# Patient Record
Sex: Female | Born: 1950 | Race: White | Hispanic: No | Smoking: Current every day smoker
Health system: Southern US, Community
[De-identification: ages and names within clinical notes are randomized; demographics above are authoritative.]

## PROBLEM LIST (undated history)

## (undated) DIAGNOSIS — J449 Chronic obstructive pulmonary disease, unspecified: Secondary | ICD-10-CM

## (undated) DIAGNOSIS — F319 Bipolar disorder, unspecified: Secondary | ICD-10-CM

## (undated) DIAGNOSIS — F419 Anxiety disorder, unspecified: Secondary | ICD-10-CM

## (undated) DIAGNOSIS — G8929 Other chronic pain: Secondary | ICD-10-CM

## (undated) DIAGNOSIS — I10 Essential (primary) hypertension: Secondary | ICD-10-CM

## (undated) DIAGNOSIS — I219 Acute myocardial infarction, unspecified: Secondary | ICD-10-CM

## (undated) DIAGNOSIS — R569 Unspecified convulsions: Secondary | ICD-10-CM

## (undated) HISTORY — PX: ROTATOR CUFF REPAIR: SHX139

## (undated) HISTORY — PX: CYSTECTOMY: SUR359

## (undated) HISTORY — PX: BREAST CYST EXCISION: SHX579

## (undated) HISTORY — PX: TUBAL LIGATION: SHX77

## (undated) HISTORY — PX: ABDOMINAL HYSTERECTOMY: SHX81

---

## 2006-06-28 ENCOUNTER — Inpatient Hospital Stay (HOSPITAL_COMMUNITY): Admission: EM | Admit: 2006-06-28 | Discharge: 2006-07-04 | Payer: Self-pay | Admitting: Emergency Medicine

## 2006-06-28 ENCOUNTER — Ambulatory Visit: Payer: Self-pay | Admitting: Cardiovascular Disease

## 2007-05-01 ENCOUNTER — Emergency Department (HOSPITAL_COMMUNITY): Admission: EM | Admit: 2007-05-01 | Discharge: 2007-05-01 | Payer: Self-pay | Admitting: Emergency Medicine

## 2010-05-10 DIAGNOSIS — R0602 Shortness of breath: Secondary | ICD-10-CM

## 2010-05-25 NOTE — H&P (Signed)
Monica Stuart, Monica Stuart                 ACCOUNT NO.:  [id number]   MEDICAL RECORD NO.:  [id number]          PATIENT TYPE:  INP   LOCATION:  A202                          FACILITY:  [facility]   PHYSICIAN:  Marcello Moores, MD   DATE OF BIRTH:  1950-12-20   DATE OF ADMISSION:  06/28/2006  DATE OF DISCHARGE:  LH                              HISTORY & PHYSICAL   PRIMARY CARE PHYSICIAN:  Unassigned.   CHIEF COMPLAINT:  Shortness of breath.   HISTORY OF PRESENT ILLNESS:  Ms. Monica Stuart is 60 year old female patient who  is a chronic smoker with history of COPD, asthma, hypertension,  depression, chronic left shoulder pain, who presented to the emergency  room with shortness of breath.  As per the patient, she had shortness of  breath since yesterday and despite she is taking her inhalers and other  medications properly, her shortness of breath worsened and she decided  to come to the emergency room.  The patient denied any chest pain,  denied fever, but she has mild dry cough.  The patient was given Solu-  Medrol treatment in the emergency room and respiratory treatment as  well, but despite that, she improved very later and admission was  called.  ABG was tried in the emergency room but failed through the  repeated sticks and the patient finally refused to allow further try.  The patient denied any GI or urinary complaint currently.   REVIEW OF SYSTEMS:  A 10-point review of system is noncontributory  except as dictated in the HPI.   ALLERGIES:  NO KNOWN DRUG ALLERGIES.   SOCIAL HISTORY:  [place] and  currently she is not working.  She is on disability.  She is a chronic  smoker but denied alcohol or any illegal drug use.   FAMILY HISTORY:  Noncontributory.   PAST MEDICAL HISTORY:  1. COPD/asthma.  2. Hypertension.  3. Depression.  4. Coronary artery disease, status post cardiac cath, 2000.  5. Chronic left shoulder pain.   HOME MEDICATIONS:  1.  Advil discussed, unspecified dose.  2. Albuterol inhaler p.r.n.  3. Prednisone 10 mg p.o. daily.  4. Ultram 50 mg p.r.n.  5. Diclofenac 100 mg once a day.  6. Seroquel 100 mg p.o. daily.  7. Lortab 5/500 mg four times a day.  8. Alprazolam unspecified dose four times a day.  9. Hydrochlorothiazide unspecified dose p.o. daily.   PHYSICAL EXAMINATION:  GENERAL APPEARANCE:  The patient is in the  emergency room bed and she has mild respiratory distress with  significant wheezes.  VITAL SIGNS:  Blood pressure is 150/74, temperature 97, pulse is 62,  respiratory rate 20 per minute, saturation is 97% on 2 liters.  HEENT:  She has pink conjunctivae.  Nonicteric sclera.  NECK:  Supple.  CHEST:  Positive air entry bilaterally with wheezes and rhonchi  diffusely bilaterally.  CARDIOVASCULAR: S1-S2 regular were heard.  No murmur.  ABDOMEN:  Soft, normoactive bowel sounds, negative tenderness.  EXTREMITIES:  She has no pedal edema.  CNS:  She is alert and well  oriented.   LABORATORY DATA:  White blood cell is 10, hemoglobin is 13, hematocrit  is 38, platelet count 275.  Chemistry shows sodium is 141, potassium is  3.7, chloride is 103, bicarb 29, glucose 144, BUN 7and creatinine 0.8  and calcium is 8.9.   A chest x-ray showed bronchitic changes, otherwise no evidence of  pulmonary mass or nodule.   ASSESSMENT:  1. Chronic obstructive pulmonary disease, asthma exacerbation. will      put her on Solu-Medrol IV and will give her respiratory treatment      every three to four hours and will continue her home medications      and will try to get ABG when the patient allows to do so.  2. Hypertension.  Fairly controlled and will continue with home      medications.  3. Coronary artery disease.  The patient has no chest pain, but will      do tomorrow echo and will send cardiac enzymes and EKG to make sure      that there is no event of ischemia.  4. Other medical problems are stable and will  continue with her home      medications and will put her on gastrointestinal as well as deep      venous thrombosis prophylaxis.      Marcello Moores, MD  Electronically Signed     MT/MEDQ  D:  06/28/2006  T:  06/29/2006  Job:  [id number]

## 2010-05-25 NOTE — Group Therapy Note (Signed)
NAME:  Monica Stuart, Monica Stuart                 ACCOUNT NO.:  [id number]   MEDICAL RECORD NO.:  [id number]          PATIENT TYPE:  INP   LOCATION:  A202                          FACILITY:  [facility]   PHYSICIAN:  Edward L. Juanetta Gosling, M.D.DATE OF BIRTH:  05/02/1950   DATE OF PROCEDURE:  DATE OF DISCHARGE:                                 PROGRESS NOTE   Patient of the hospitalist.  Monica Stuart seems to be doing pretty well.  She is having no new complaints.   PHYSICAL EXAMINATION:  Temperature is 97.9, pulse 75, respirations 20,  blood pressure 159/87, blood sugar 115.  Had ordered O2 sat on room air.   Her white count today 16,300, hemoglobin is 13.9, platelets 298,  electrolytes, CO2 is 34 be glucose 117, otherwise normal.   ASSESSMENT:  She is got severe chronic obstructive pulmonary disease.   PLAN:  Try to see if we can get her oxygenated.  She may need O2 at  home.  She clearly needs to stop smoking and she is hoping to be able to  do that.      Edward L. Juanetta Gosling, M.D.  Electronically Signed     ELH/MEDQ  D:  07/04/2006  T:  07/04/2006  Job:  [id number]

## 2010-05-25 NOTE — Consult Note (Signed)
NAME:  Monica Stuart, Monica Stuart                 ACCOUNT NO.:  [id number]   MEDICAL RECORD NO.:  [id number]          PATIENT TYPE:  INP   LOCATION:  A202                          FACILITY:  [facility]   PHYSICIAN:  Edward L. Juanetta Gosling, M.D.DATE OF BIRTH:  [birth year]   DATE OF CONSULTATION:  DATE OF DISCHARGE:                                 CONSULTATION   Monica Stuart is a 60 year old who has a long known history of multiple  medical problems including COPD with an asthmatic component,  hypertension, depression, chronic left shoulder pain, coronary artery  occlusive disease.  She had been hospitalized since the 18th, with an  acute exacerbation of COPD.  She says she feels like she is about back  to baseline now.  She is not having any chest pain.  She does have some  cough still.  She is bringing up some sputum.  She has not had any  fever.   PAST MEDICAL HISTORY:  Positive for the COPD/asthma, hypertension,  depression, coronary disease, status post cardiac catheterization and  left shoulder pain.   MEDICATIONS:  She is on:  1. Advair, I believe the 250/50 size at home.  2. Albuterol inhaler on a p.r.n. basis.  3. A nebulizer at home on a p.r.n. basis.  4. Prednisone 10 mg daily.  5. Ultram 50 mg q.4 h p.r.n. pain.  6. Diclofenac 100 mg daily.  7. Seroquel 100 mg daily.  8. Lortab 5/500 four times a day as needed.  9. Xanax I am not sure that dose.  10.HCTZ.   FAMILY HISTORY:  She has a brother who recently died of lung cancer, so  she is very interested in trying to stop smoking.   SOCIAL HISTORY:  She has about a 50 pack-year smoking history.  She does  not use any alcohol or use any illicit drugs.   REVIEW OF SYSTEMS:  Other than as mentioned is negative.   PHYSICAL EXAMINATION:  GENERAL:  Shows a well-developed, well-nourished  female who does not appear to be in any acute distress now.  VITAL SIGNS:  Her temperature is 98.3, pulse 70, respirations 18, blood  pressure 162/101, O2 sats 98%  on 2 liters, blood sugar 156.  She also  has a history of diabetes, and she says she has been started on Crestor,  but she says that is for her blood sugar and of course it is for  cholesterol.  I am not sure if she has got the medication confused for  what.  HEENT:  Her pupils are reactive, and nose and throat are clear.  NECK:  Supple without masses.  CHEST:  With rhonchi bilaterally.  HEART:  Regular without murmur, gallop or rub.  ABDOMEN:  Soft.  EXTREMITIES:  Showed no edema.  CENTRAL NERVOUS SYSTEM:  Examination shows that she appears to be  anxious but otherwise intact.   LABORATORY WORK:  Blood gas shows a pH 7.46, pCO2 of 36, pO2 of 61, this  on room air.  CBC:  White count 13,800, hemoglobin 13.9, platelets 306.  BMET shows CO2 is 33, glucose 163,  BUN 15, creatinine 0.8.  CHEST X-RAY:  Bronchitis.   ASSESSMENT:  I think she does have severe chronic obstructive pulmonary  disease, and at this point I agree with treatment plan.  I would like to  check an O2 sat in the morning, off of oxygen.  She says she is getting  close to her baseline, and I expect that she probably is.  She clearly  needs to stop smoking if she can, and as best I can tell she has not had  a pulmonary function test done recently, but that could be done as an  outpatient, when she is doing a bit better.  Thanks for allowing me to  see her with you.      Edward L. Juanetta Gosling, M.D.  Electronically Signed     ELH/MEDQ  D:  07/03/2006  T:  07/04/2006  Job:  [id number]   cc:   Samuel Jester  Fax: [phone]

## 2010-05-25 NOTE — Procedures (Signed)
NAME:  Monica Stuart, Monica Stuart                 ACCOUNT NO.:  [id number]   MEDICAL RECORD NO.:  [id number]          PATIENT TYPE:  INP   LOCATION:  A202                          FACILITY:  [facility]   PHYSICIAN:  Noralyn Pick. Eden Emms, MD, FACCDATE OF BIRTH:  1950-08-03   DATE OF PROCEDURE:  06/30/2006  DATE OF DISCHARGE:                                ECHOCARDIOGRAM   PROCEDURE:  2 D echocardiogram.   INDICATION:  Chronic obstructive pulmonary disease, asthma, check left  ventricular function.  Left ventricular cavity size is normal.  There is  mild LVH, EF of 60%.  There are no regional wall motion abnormalities.  There is mild left atrial enlargement.  Right side of cardiac chambers  were normal.  There was no evidence of pulmonary hypertension.  There  was mild TR.  Aortic valve was trileaflet and sclerotic.  There was no  stenosis.  Aortic root was normal.  Mitral valve was mildly thickened  with trivial mitral regurgitation.  Subcostal imaging revealed no  atrioseptal defect, no source of embolus, no effusion.  M-mode  measurement showed an aortic dimension of 27 mm.  Left atrial dimension  was 41 mm.  Septal thickness was 14 mm.  LV diastolic dimension was 41  mm.  LV systolic dimension was 41 mm.  LV systolic dimension was 29 mm.   FINAL IMPRESSION:  1. Mild left ventricular hypertrophy, ejection fraction 60%.  2. Mild left atrial enlargement.  3. Aortic valve sclerosis.  4. Trivial mitral regurgitation.  5. Normal right ventricle.  6. No pericardial effusion.      Noralyn Pick. Eden Emms, MD, [facility]  Electronically Signed     PCN/MEDQ  D:  06/30/2006  T:  06/30/2006  Job:  [id number]

## 2010-05-25 NOTE — Discharge Summary (Signed)
NAMEKHALEA, VENTURA                 ACCOUNT NO.:  [id number]   MEDICAL RECORD NO.:  [id number]          PATIENT TYPE:  INP   LOCATION:  A202                          FACILITY:  [facility]   PHYSICIAN:  Osvaldo Shipper, MD     DATE OF BIRTH:  12/25/1950   DATE OF ADMISSION:  06/28/2006  DATE OF DISCHARGE:  06/24/2008LH                               DISCHARGE SUMMARY   PRIMARY CARE PHYSICIAN:  Samuel Jester, M.D.   DISCHARGE DIAGNOSES:  1. Acute chronic obstructive pulmonary disease exacerbation improved.  2. History of hypertension requiring additional medications.  3. History of depression.  4. History of coronary artery disease, stable.  5. History of chronic left shoulder pain with recent surgery.   Please see full H&P dictated by Marcello Moores, M.D. for details  regarding the patient's presenting illness.   HOSPITAL COURSE:  Briefly, this is a 60 year old Caucasian female who  presented to the hospital with shortness of breath.  She is a smoker  with a history of COPD and was diagnosed with COPD exacerbation.  The  patient was put on steroids on nebulizer treatment with antibiotics.  She was slow to improve.  She was also seen by pulmonologist, Dr.  Juanetta Gosling.  She had ABG on presentation which showed a pH 7.41, pCO2 42.  Her bicarb was 29 on the peripheral blood.  The patient was essentially  managed for the above mentioned treatment.  Over the past two days she  has been feeling much better.  Today she is able to ambulate with no  difficulty.  She subjectively feels much better as well.  She is very  keen on going home.  She is on a nicotine patch and requesting a  prescription for Chantix.  I have told her that she needs to talk to her  PMD about getting the same.  I will write out a prescription for  nicotine patch, however.   She was also concerned about the diagnosis of diabetes.  Her blood  sugars were a little bit high, the highest was 173, probably because the  patient  was on steroids.  The patient is on a chronic dose of 10 mg of  prednisone on a daily basis.  Unfortunately her HB1C was not checked  during this admission.  I have told her that she needed to talk to her  physician about pursuing evaluation and diagnosis of diabetes.  At this  point, her blood sugars have improved to 115, 170, 123.  Hence I do not  see the need to really start her on immediate medication.  I have  encouraged her to talk to her PMD quickly as soon as possible.   Her blood pressures have been running a little bit high, not well  controlled with hydrochlorothiazide, hence, I am going to add a dose of  Norvasc to her regimen.   Her room air O2 was 93 today.  She has been ambulating.  I think she is  ready to go home.  Her last PFT was more than 5 years ago, hence, I  think we will  set up another PFT with [facility] of her as an outpatient in a  couple of weeks.  I will also set up an appointment with Dr. Juanetta Gosling in  a couple of weeks.  She did have an x-ray done on admission of the chest  that did not show any evidence for a mass or a nodule.   The patient has been strongly counseled about smoking cessation and she  seems to understands and seems motivated.   DISCHARGE MEDICATIONS:  1. Norvasc 5 mg p.o. daily.  2. Albuterol nebulizer 2.5 mg every 6 hours.  3. Atrovent nebulizer 0.5 mg every 6 hours.  4. Nicotine patch 21 mg daily.  5. Levaquin 750 mg daily for two more days.  6. Prednisone taper 60 mg for three days, 40 mg for five days, 20 mg      for five days, and then 10 mg daily as before.  7. Crestor unknown dose daily.  8. Hydrochlorothiazide 25 mg daily.  9. Xanax 0.25 mg q.i.d.  10.Lortab 5/500 mg as needed.  11.Seroquel 100 mg q.h.s.  12.Diclofenac as needed.  13.Ultram as needed.  14.Cymbalta 60 mg daily.  15.Advair Diskus twice daily.   FOLLOWUP:  1. With her PMD in 1 weeks' time.  2. With Dr. Juanetta Gosling in 3 weeks.  3. PFT with [facility] in 2 weeks.   DIET:   She can have a heart healthy diet.   ACTIVITY:  Increase activity slowly.   The patient does not qualify for home O2 at this time as her saturations  are 93% on room air.   CONSULTATIONS:  Dr. Juanetta Gosling during this admission.   Total time of discharge was about 35 minutes.      Osvaldo Shipper, MD  Electronically Signed     GK/MEDQ  D:  07/04/2006  T:  07/04/2006  Job:  [id number]   cc:   Ramon Dredge L. Juanetta Gosling, M.D.  Fax: [phone]   Samuel Jester  Fax: [phone]

## 2010-10-27 LAB — BASIC METABOLIC PANEL
BUN: 16
BUN: 7
CO2: 27
CO2: 28
CO2: 33 — ABNORMAL HIGH
CO2: 34 — ABNORMAL HIGH
Calcium: 8.6
Calcium: 8.7
Chloride: 102
Chloride: 103
Chloride: 104
Chloride: 105
Chloride: 106
Chloride: 109
Creatinine, Ser: 0.77
GFR calc Af Amer: >60
GFR calc Af Amer: >60
GFR calc Af Amer: >60
GFR calc Af Amer: >60
GFR calc non Af Amer: >60
Glucose, Bld: 144 — ABNORMAL HIGH
Glucose, Bld: 150 — ABNORMAL HIGH
Glucose, Bld: 172 — ABNORMAL HIGH
Glucose, Bld: 173 — ABNORMAL HIGH
Potassium: 3.7
Potassium: 3.7
Potassium: 4.1
Potassium: 4.5
Potassium: 4.5
Potassium: 4.7
Sodium: 139
Sodium: 141
Sodium: 141
Sodium: 143
Sodium: 144

## 2010-10-27 LAB — DIFFERENTIAL
Band Neutrophils: 0
Basophils Absolute: 0
Basophils Relative: 0
Basophils Relative: 0
Basophils Relative: 0
Blasts: 0
Eosinophils Absolute: 0.1
Eosinophils Absolute: 0.1
Eosinophils Relative: 0
Eosinophils Relative: 1
Lymphocytes Relative: 12
Lymphocytes Relative: 16
Lymphocytes Relative: 17
Lymphs Abs: 1.7
Metamyelocytes Relative: 0
Monocytes Absolute: 0.4
Monocytes Absolute: 0.6
Monocytes Relative: 4
Myelocytes: 0
Neutro Abs: 12 — ABNORMAL HIGH
Neutro Abs: 9.1 — ABNORMAL HIGH
Neutrophils Relative %: 56
Promyelocytes Absolute: 0
Promyelocytes Absolute: 0
nRBC: 1 — ABNORMAL HIGH

## 2010-10-27 LAB — BLOOD GAS, ARTERIAL
Acid-Base Excess: 3 — ABNORMAL HIGH
Bicarbonate: 25.6 — ABNORMAL HIGH
O2 Content: 2.5
O2 Saturation: 96.8
TCO2: 22.1
TCO2: 24
pCO2 arterial: 42.9
pH, Arterial: 7.461 — ABNORMAL HIGH
pO2, Arterial: 85.2

## 2010-10-27 LAB — CARDIAC PANEL(CRET KIN+CKTOT+MB+TROPI)
CK, MB: 1.1
Relative Index: INVALID
Relative Index: INVALID
Relative Index: INVALID
Total CK: 28
Total CK: 36
Troponin I: 0.03

## 2010-10-27 LAB — CBC
HCT: 36.7
HCT: 38.3
HCT: 38.7
Hemoglobin: 12.7
Hemoglobin: 13.5
Hemoglobin: 13.9
MCHC: 34
MCHC: 34.5
MCHC: 34.5
MCV: 84.7
MCV: 85.9
MCV: 86.6
MCV: 86.9
Platelets: 275
Platelets: 294
RBC: 4.28
RBC: 4.64
RDW: 12.9
RDW: 13.3
RDW: 13.4
WBC: 10.1

## 2011-01-23 ENCOUNTER — Emergency Department (HOSPITAL_COMMUNITY)
Admission: EM | Admit: 2011-01-23 | Discharge: 2011-01-23 | Disposition: A | Discharge status: Home / Self Care | Payer: Medicare Other | Attending: Emergency Medicine | Admitting: Emergency Medicine

## 2011-01-23 ENCOUNTER — Emergency Department (HOSPITAL_COMMUNITY): Payer: Medicare Other

## 2011-01-23 ENCOUNTER — Other Ambulatory Visit: Payer: Self-pay

## 2011-01-23 ENCOUNTER — Encounter (HOSPITAL_COMMUNITY): Payer: Self-pay | Admitting: Emergency Medicine

## 2011-01-23 DIAGNOSIS — I10 Essential (primary) hypertension: Secondary | ICD-10-CM | POA: Insufficient documentation

## 2011-01-23 DIAGNOSIS — Z9079 Acquired absence of other genital organ(s): Secondary | ICD-10-CM | POA: Insufficient documentation

## 2011-01-23 DIAGNOSIS — I252 Old myocardial infarction: Secondary | ICD-10-CM | POA: Insufficient documentation

## 2011-01-23 DIAGNOSIS — Z7982 Long term (current) use of aspirin: Secondary | ICD-10-CM | POA: Insufficient documentation

## 2011-01-23 DIAGNOSIS — F319 Bipolar disorder, unspecified: Secondary | ICD-10-CM | POA: Insufficient documentation

## 2011-01-23 DIAGNOSIS — Z9851 Tubal ligation status: Secondary | ICD-10-CM | POA: Insufficient documentation

## 2011-01-23 DIAGNOSIS — J441 Chronic obstructive pulmonary disease with (acute) exacerbation: Secondary | ICD-10-CM | POA: Insufficient documentation

## 2011-01-23 DIAGNOSIS — G8929 Other chronic pain: Secondary | ICD-10-CM | POA: Insufficient documentation

## 2011-01-23 DIAGNOSIS — F17200 Nicotine dependence, unspecified, uncomplicated: Secondary | ICD-10-CM | POA: Insufficient documentation

## 2011-01-23 DIAGNOSIS — E119 Type 2 diabetes mellitus without complications: Secondary | ICD-10-CM | POA: Insufficient documentation

## 2011-01-23 DIAGNOSIS — R569 Unspecified convulsions: Secondary | ICD-10-CM | POA: Insufficient documentation

## 2011-01-23 HISTORY — DX: Other chronic pain: G89.29

## 2011-01-23 HISTORY — DX: Essential (primary) hypertension: I10

## 2011-01-23 HISTORY — DX: Unspecified convulsions: R56.9

## 2011-01-23 HISTORY — DX: Acute myocardial infarction, unspecified: I21.9

## 2011-01-23 HISTORY — DX: Chronic obstructive pulmonary disease, unspecified: J44.9

## 2011-01-23 HISTORY — DX: Bipolar disorder, unspecified: F31.9

## 2011-01-23 LAB — POCT I-STAT TROPONIN I: Troponin i, poc: 0 ng/mL (ref 0.00–0.08)

## 2011-01-23 MED ORDER — ALBUTEROL SULFATE HFA 108 (90 BASE) MCG/ACT IN AERS: 1.0000 | INHALATION_SPRAY | Freq: Four times a day (QID) | RESPIRATORY_TRACT | Status: DC | PRN

## 2011-01-23 MED ORDER — IPRATROPIUM BROMIDE 0.02 % IN SOLN
0.5000 mg | Freq: Once | RESPIRATORY_TRACT | Status: AC
  Administered 2011-01-23: 0.5 mg via RESPIRATORY_TRACT
  Filled 2011-01-23: qty 2.5

## 2011-01-23 MED ORDER — PREDNISONE 20 MG PO TABS
40.0000 mg | ORAL_TABLET | Freq: Once | ORAL | Status: AC
  Administered 2011-01-23: 40 mg via ORAL
  Filled 2011-01-23: qty 2

## 2011-01-23 MED ORDER — PREDNISONE 20 MG PO TABS
ORAL_TABLET | ORAL | Status: AC
  Filled 2011-01-23: qty 1

## 2011-01-23 MED ORDER — PREDNISONE 20 MG PO TABS: 40.0000 mg | ORAL_TABLET | Freq: Every day | ORAL | Status: AC

## 2011-01-23 MED ORDER — ALBUTEROL SULFATE (5 MG/ML) 0.5% IN NEBU
5.0000 mg | INHALATION_SOLUTION | Freq: Once | RESPIRATORY_TRACT | Status: AC
  Administered 2011-01-23: 5 mg via RESPIRATORY_TRACT
  Filled 2011-01-23: qty 1

## 2011-01-23 NOTE — ED Provider Notes (Signed)
History    60yf with cough and sob. Symptom onset about a week ago. Relatively constant. Denies chest pain. No fever or chills. Has a history of COPD. She continues to smoke. No unusual lower extremity edema or leg pain. Denies history of blood clot. No Sick contacts.  CSN: [id number]  Arrival date & time 01/23/11  1731   First MD Initiated Contact with Patient 01/23/11 1752      Chief Complaint  Patient presents with  . Shortness of Breath  . Cough    (Consider location/radiation/quality/duration/timing/severity/associated sxs/prior treatment) HPI  Past Medical History  Diagnosis Date  . COPD (chronic obstructive pulmonary disease)   . Asthma   . Diabetes mellitus   . MI (myocardial infarction)   . Hypertension   . Seizures   . Manic depressive disorder   . Chronic pain     Past Surgical History  Procedure Date  . Rotator cuff repair   . Breast cyst excision   . Cystectomy     left leg  . Tubal ligation   . Abdominal hysterectomy     Family History  Problem Relation Age of Onset  . Cancer Mother   . Stroke Father     History  Substance Use Topics  . Smoking status: Current Everyday Smoker -- 0.5 packs/day for 40 years    Types: Cigarettes  . Smokeless tobacco: Never Used  . Alcohol Use: No    OB History    Grav Para Term Preterm Abortions TAB SAB Ect Mult Living   4 4 4       4       Review of Systems   Review of symptoms negative unless otherwise noted in HPI.   Allergies  Mucinex  Home Medications   Current Outpatient Rx  Name Route Sig Dispense Refill  . ALPRAZOLAM 1 MG PO TABS Oral Take 1 mg by mouth 4 (four) times daily.    Marland Kitchen AMLODIPINE BESYLATE 5 MG PO TABS Oral Take 5 mg by mouth daily.    . ASPIRIN EC 81 MG PO TBEC Oral Take 81 mg by mouth daily.    Marland Kitchen DIAZEPAM 10 MG PO TABS Oral Take 10 mg by mouth at bedtime.    . FUROSEMIDE 40 MG PO TABS Oral Take 40 mg by mouth daily. Patient takes 1 tablet in the morning and 1/2 tablet every  afternoon    . LOSARTAN POTASSIUM 100 MG PO TABS Oral Take 100 mg by mouth daily.    Marland Kitchen METFORMIN HCL 500 MG PO TABS Oral Take 500 mg by mouth daily.    Marland Kitchen MONTELUKAST SODIUM 10 MG PO TABS Oral Take 10 mg by mouth at bedtime.    . OMEPRAZOLE 20 MG PO CPDR Oral Take 20 mg by mouth 2 (two) times daily.    . OXYCODONE HCL 15 MG PO TABS Oral Take 15 mg by mouth every 6 (six) hours as needed. Pain    . ROFLUMILAST 500 MCG PO TABS Oral Take 500 mcg by mouth daily.    Marland Kitchen TIOTROPIUM BROMIDE MONOHYDRATE 18 MCG IN CAPS Inhalation Place 18 mcg into inhaler and inhale daily.      BP 117/43  Pulse 83  Temp(Src) 97.7 F (36.5 C) (Oral)  Resp 21  Ht 5\' 5"  (1.651 m)  Wt 140 lb (63.504 kg)  BMI 23.30 kg/m2  SpO2 100%  Physical Exam  Nursing note and vitals reviewed. Constitutional: She appears well-developed and well-nourished. No distress.  HENT:  Head: Normocephalic  and atraumatic.  Right Ear: External ear normal.  Left Ear: External ear normal.  Mouth/Throat: Oropharynx is clear and moist.  Eyes: Conjunctivae are normal. Right eye exhibits no discharge. Left eye exhibits no discharge.  Neck: Normal range of motion. Neck supple.  Cardiovascular: Normal rate, regular rhythm and normal heart sounds.  Exam reveals no gallop and no friction rub.   No murmur heard. Pulmonary/Chest: Effort normal. No respiratory distress. She has wheezes. She exhibits no tenderness.       Faint expiratory wheezing throughout.  Abdominal: Soft. She exhibits no distension. There is no tenderness.  Musculoskeletal: She exhibits no edema and no tenderness.  Lymphadenopathy:    She has no cervical adenopathy.  Neurological: She is alert.  Skin: Skin is warm and dry. She is not diaphoretic.  Psychiatric: She has a normal mood and affect. Her behavior is normal. Thought content normal.    ED Course  Procedures (including critical care time)   Labs Reviewed  POCT I-STAT TROPONIN I  LAB REPORT - SCANNED  I-STAT  TROPONIN I   Dg Chest 2 View  01/23/2011  *RADIOLOGY REPORT*  Clinical Data: Shortness of breath.  CHEST - 2 VIEW  Comparison: Chest 06/28/2006.  Findings: The chest is hyperexpanded but the lungs are clear.  No pneumothorax or effusion.  Heart size normal.  IMPRESSION: Pulmonary hyperexpansion compatible with emphysema.  No acute disease.  Original Report Authenticated By: Bernadene Bell. D'ALESSIO, M.D.    EKG:  Rhythm: Normal sinus Rate: 75 Axis: Normal Intervals: Normal ST segments: Nonspecific ST changes. T wave flattening in V2.  1. COPD exacerbation       MDM  61 year old female with cough and congestion. Clinically suspect COPD exacerbation. Chest x-ray is consistent with chronic changes likely from COPD. No evidence of acute focal infiltrate. Patient is afebrile. No respiratory distress on exam. EKG does not have any ischemic changes. Troponin is within normal limits. Low clinical suspicion for ACS. Plan course of steroids and symptomatic treatment. Return precautions discussed. Outpatient followup.        Raeford Razor, MD 02/01/11 1325

## 2011-01-23 NOTE — ED Notes (Signed)
Patient brought in via EMS from home. Alert and oriented. Airway patent. Patient c/o cough, congestion, and shortness of breath x1 week that has progressively gotten worse. Denies any fevers. Hx of asthma/COPD. Per EMS 1 duoneb given. O2 sat 98% on room air per EMS.

## 2011-08-04 ENCOUNTER — Emergency Department (HOSPITAL_COMMUNITY): Payer: Medicare Other

## 2011-08-04 ENCOUNTER — Emergency Department (HOSPITAL_COMMUNITY)
Admission: EM | Admit: 2011-08-04 | Discharge: 2011-08-04 | Disposition: A | Discharge status: Home / Self Care | Payer: Medicare Other | Attending: Emergency Medicine | Admitting: Emergency Medicine

## 2011-08-04 ENCOUNTER — Encounter (HOSPITAL_COMMUNITY): Payer: Self-pay | Admitting: Emergency Medicine

## 2011-08-04 DIAGNOSIS — Y9301 Activity, walking, marching and hiking: Secondary | ICD-10-CM | POA: Insufficient documentation

## 2011-08-04 DIAGNOSIS — E119 Type 2 diabetes mellitus without complications: Secondary | ICD-10-CM | POA: Insufficient documentation

## 2011-08-04 DIAGNOSIS — J449 Chronic obstructive pulmonary disease, unspecified: Secondary | ICD-10-CM | POA: Insufficient documentation

## 2011-08-04 DIAGNOSIS — IMO0002 Reserved for concepts with insufficient information to code with codable children: Secondary | ICD-10-CM | POA: Insufficient documentation

## 2011-08-04 DIAGNOSIS — G8929 Other chronic pain: Secondary | ICD-10-CM | POA: Insufficient documentation

## 2011-08-04 DIAGNOSIS — W19XXXA Unspecified fall, initial encounter: Secondary | ICD-10-CM | POA: Insufficient documentation

## 2011-08-04 DIAGNOSIS — F17200 Nicotine dependence, unspecified, uncomplicated: Secondary | ICD-10-CM | POA: Insufficient documentation

## 2011-08-04 DIAGNOSIS — Z79899 Other long term (current) drug therapy: Secondary | ICD-10-CM | POA: Insufficient documentation

## 2011-08-04 DIAGNOSIS — I252 Old myocardial infarction: Secondary | ICD-10-CM | POA: Insufficient documentation

## 2011-08-04 DIAGNOSIS — S73102A Unspecified sprain of left hip, initial encounter: Secondary | ICD-10-CM

## 2011-08-04 DIAGNOSIS — Z7982 Long term (current) use of aspirin: Secondary | ICD-10-CM | POA: Insufficient documentation

## 2011-08-04 DIAGNOSIS — J4489 Other specified chronic obstructive pulmonary disease: Secondary | ICD-10-CM | POA: Insufficient documentation

## 2011-08-04 MED ORDER — ONDANSETRON HCL 4 MG/2ML IJ SOLN
4.0000 mg | Freq: Four times a day (QID) | INTRAMUSCULAR | Status: DC | PRN
  Administered 2011-08-04: 4 mg via INTRAMUSCULAR
  Filled 2011-08-04: qty 2

## 2011-08-04 MED ORDER — METHOCARBAMOL 500 MG PO TABS: ORAL_TABLET | ORAL | Status: DC

## 2011-08-04 MED ORDER — MORPHINE SULFATE 4 MG/ML IJ SOLN
4.0000 mg | Freq: Once | INTRAMUSCULAR | Status: AC
  Administered 2011-08-04: 4 mg via INTRAMUSCULAR
  Filled 2011-08-04: qty 1

## 2011-08-04 MED ORDER — OXYCODONE-ACETAMINOPHEN 5-325 MG PO TABS
1.0000 | ORAL_TABLET | Freq: Once | ORAL | Status: AC
  Administered 2011-08-04: 1 via ORAL
  Filled 2011-08-04: qty 1

## 2011-08-04 MED ORDER — PERCOCET 5-325 MG PO TABS: 1.0000 | ORAL_TABLET | Freq: Four times a day (QID) | ORAL | Status: AC | PRN

## 2011-08-04 NOTE — ED Provider Notes (Cosign Needed)
History  This chart was scribed for Ward Givens, MD by Erskine Emery. This patient was seen in room APFT20/APFT20 and the patient's care was started at 13:34.   CSN: [id number]  Arrival date & time 08/04/11  1325   First MD Initiated Contact with Patient 08/04/11 1334      Chief Complaint  Patient presents with  . Fall    (Consider location/radiation/quality/duration/timing/severity/associated sxs/prior treatment) HPI  YESLY GERETY is a 61 y.o. female who presents to the Emergency Department complaining of a throbbing, shooting left hip pain. Pt reports she fell yesterday when he flip flop broke and fell  on to her left side and arm. Pt was not able to get up after the fall but has been ambulatory with a walker since. Pt typically walks independently. Pt also has some erythema on the left wrist from a burn. She denies hitting her head or LOC.  Pt has a h/o heart attack and stent placement (2) in 2000. Pt also has a h/o DM and typically has fluctuating blood-glucose levels so she ate a danish just PTA.  Pt's PCP is Sherlean Foot.  Pt had a shoulder surgery in [place] but cannot recall who her orthopedist was.  Past Medical History  Diagnosis Date  . COPD (chronic obstructive pulmonary disease)   . Asthma   . Diabetes mellitus   . MI (myocardial infarction)   . Hypertension   . Seizures   . Manic depressive disorder   . Chronic pain     Past Surgical History  Procedure Date  . Rotator cuff repair   . Breast cyst excision   . Cystectomy     left leg  . Tubal ligation   . Abdominal hysterectomy     Family History  Problem Relation Age of Onset  . Cancer Mother   . Stroke Father     History  Substance Use Topics  . Smoking status: Current Everyday Smoker -- 0.5 packs/day for 40 years    Types: Cigarettes  . Smokeless tobacco: Never Used  . Alcohol Use: No  lives alone  OB History    Grav Para Term Preterm Abortions TAB SAB Ect Mult Living   4 4 4       4      Pt  lives alone and still smokes sometimes.   Review of Systems  Constitutional: Negative for fever and chills.  HENT: Negative for neck pain.   Respiratory: Negative for shortness of breath.   Cardiovascular: Negative for chest pain.  Gastrointestinal: Negative for nausea, vomiting and abdominal pain.  Musculoskeletal: Positive for arthralgias (left hip and shoulder pain).  Neurological: Negative for weakness.    Allergies  Amitriptyline and Guaifenesin er  Home Medications   Current Outpatient Rx  Name Route Sig Dispense Refill  . ALBUTEROL SULFATE HFA 108 (90 BASE) MCG/ACT IN AERS Inhalation Inhale 1-2 puffs into the lungs every 6 (six) hours as needed for wheezing. 1 Inhaler 2  . ALPRAZOLAM 2 MG PO TABS Oral Take 2 mg by mouth 3 (three) times daily.    Marland Kitchen AMLODIPINE BESYLATE 5 MG PO TABS Oral Take 5 mg by mouth daily.    . ASPIRIN EC 81 MG PO TBEC Oral Take 81 mg by mouth daily.    Marland Kitchen FLUTICASONE PROPIONATE  HFA 220 MCG/ACT IN AERO Inhalation Inhale 1 puff into the lungs daily as needed.    . FUROSEMIDE 40 MG PO TABS Oral Take 20-40 mg by mouth daily. Patient takes  1 tablet in the morning and 1/2 tablet every afternoon    . IBUPROFEN-DIPHENHYDRAMINE CIT 200-38 MG PO TABS Oral Take 1 tablet by mouth at bedtime as needed. Sleep    . LOSARTAN POTASSIUM 100 MG PO TABS Oral Take 100 mg by mouth daily.    Marland Kitchen METFORMIN HCL 500 MG PO TABS Oral Take 500 mg by mouth daily.    Marland Kitchen MONTELUKAST SODIUM 10 MG PO TABS Oral Take 10 mg by mouth at bedtime.    . CENTRUM PO Oral Take 1 tablet by mouth daily.    . OMEGA-3-ACID ETHYL ESTERS 1 G PO CAPS Oral Take 1 g by mouth daily.    Marland Kitchen OMEPRAZOLE 20 MG PO CPDR Oral Take 20 mg by mouth 2 (two) times daily.    . OXYCODONE HCL 15 MG PO TABS Oral Take 15 mg by mouth every 6 (six) hours as needed. Pain    . ROFLUMILAST 500 MCG PO TABS Oral Take 500 mcg by mouth daily.    Marland Kitchen TIOTROPIUM BROMIDE MONOHYDRATE 18 MCG IN CAPS Inhalation Place 18 mcg into inhaler and  inhale daily.    Marland Kitchen VITAMIN E 400 UNITS PO CAPS Oral Take 400 Units by mouth daily.      Dispense as written.    BP 154/71  Pulse 100  Temp 98.5 F (36.9 C) (Oral)  Resp 23  Ht 5\' 6"  (1.676 m)  Wt 149 lb (67.586 kg)  BMI 24.05 kg/m2  SpO2 98%  Vital signs normal    Physical Exam  Nursing note and vitals reviewed. Constitutional: She is oriented to person, place, and time. She appears well-developed and well-nourished. No distress.  HENT:  Head: Normocephalic and atraumatic.  Right Ear: External ear normal.  Left Ear: External ear normal.  Nose: Nose normal.  Mouth/Throat: Oropharynx is clear and moist.  Eyes: Conjunctivae and EOM are normal. Pupils are equal, round, and reactive to light.  Neck: Normal range of motion. Neck supple. No tracheal deviation present.  Cardiovascular: Normal rate, regular rhythm and normal heart sounds.   Pulmonary/Chest: Effort normal and breath sounds normal. No respiratory distress.  Abdominal: Soft. Bowel sounds are normal.  Musculoskeletal: Normal range of motion. She exhibits tenderness.       Tender over the true left hip joint. Clavicle was nontender, good ROM. Pt has a pilllow under her left knee to keep her hip in a comfrotable position. Unable to assess for limb shortening or rotation.    Neurological: She is alert and oriented to person, place, and time.  Skin: Skin is warm and dry. There is erythema.       Burn on left wrist  Psychiatric: She has a normal mood and affect. Her behavior is normal.    ED Course  Procedures (including critical care time)  Medications  ondansetron (ZOFRAN) injection 4 mg (4 mg Intramuscular Given 08/04/11 1402)  morphine 4 MG/ML injection 4 mg (4 mg Intramuscular Given 08/04/11 1404)  oxyCODONE-acetaminophen (PERCOCET/ROXICET) 5-325 MG per tablet 1 tablet (1 tablet Oral Given 08/04/11 1738)      DIAGNOSTIC STUDIES: Oxygen Saturation is 98% on room air, normal by my interpretation.    COORDINATION  OF CARE: 13:43--I evaluated the patient and we discussed a treatment plan including x-rays to which the pt agreed.   13:51--Medication order: Ondansetron (Zofran) injection 4 mg every 6 hours PRN  14:00--Medication order: morphine 4 mg/mL injection 4 mg--once  14:55--I rechecked the pt, her pain has decreased from the medication. Pt  is agreeable to trying an MRI for further analysis.   Review of [place] controlled site shows patient gets 120 oxycodone 15 mg tablets, the last was July 1. Patient states she thinks her grandchildren still her pain medicine 2 weeks ago and she's been without her pain medicine. She was advised she will need followup with Dr. Prudy Feeler to get back on her regularly scheduled pain medication.   Dg Chest 1 View  08/04/2011  *RADIOLOGY REPORT*  Clinical Data:  Fall, smoker  CHEST - 1 VIEW  Comparison: 01/23/2011  Findings: Normal heart size, mediastinal contours, and pulmonary vascularity. Atherosclerotic calcification aorta. Emphysematous and bronchitic changes consistent with COPD. No definite infiltrate, pleural effusion or pneumothorax. Bones diffusely demineralized without definite fracture.  IMPRESSION: Changes of COPD. No acute abnormalities.  Original Report Authenticated By: Lollie Marrow, M.D.   Dg Hip Complete Left  08/04/2011  *RADIOLOGY REPORT*  Clinical Data: Fall  LEFT HIP - COMPLETE 2+ VIEW  Comparison: None  Findings: Osseous demineralization. Symmetric hip and SI joints. Mild circumferential spur formation at left femoral head. No acute fracture, dislocation, or bone destruction. Numerous pelvic phleboliths.  IMPRESSION: No definite acute osseous findings. Degenerative changes left hip joint.  Original Report Authenticated By: Lollie Marrow, M.D.   Mr Hip Left Wo Contrast  08/04/2011  *RADIOLOGY REPORT*  Clinical Data: Larey Seat.  Left hip pain.  Evaluate for possible fracture.  MRI OF THE LEFT HIP WITHOUT CONTRAST  Technique:  Multiplanar,  multisequence MR imaging was performed. No intravenous contrast was administered.  Comparison: Left hip radiographs 08/04/2011.  Findings: No acute hip fracture is identified.  There is however extensive muscular injury surrounding hip with moderate edema and fluid in the left gluteus maximus muscle, left gluteus medius muscle and left hip adductors.  No pelvic fractures are seen.  The pubic rami are intact.  The pubic symphysis and SI joints are intact.  There is a small left hip joint effusion which may be traumatic synovitis.  No significant intrapelvic abnormalities.  IMPRESSION:  1.  Muscle tears and muscle strains involving the left hip musculature. 2.  No acute hip or pelvic fracture. 3.  Left hip joint effusion, likely post-traumatic synovitis.  Original Report Authenticated By: P. Loralie Champagne, M.D.   Dg Shoulder Left  08/04/2011  *RADIOLOGY REPORT*  Clinical Data: Fall, smoker, history COPD, asthma, hypertension  LEFT SHOULDER - 2+ VIEW  Comparison: 05/01/2007  Findings: Osseous demineralization. AC joint alignment normal. No definite acute fracture, dislocation, or bone destruction. Visualized left ribs appear intact.  IMPRESSION: Osseous demineralization. No acute abnormalities.  Original Report Authenticated By: Lollie Marrow, M.D.     1. Sprain of left hip   2. Fall    New Prescriptions   METHOCARBAMOL (ROBAXIN) 500 MG TABLET    Take 1 or 2 po TID for muscle soreness   PERCOCET 5-325 MG PER TABLET    Take 1 tablet by mouth every 6 (six) hours as needed for pain.   Plan discharge  Devoria Albe, MD, FACEP    MDM      I personally performed the services described in this documentation, which was scribed in my presence. The recorded information has been reviewed and considered.          Ward Givens, MD 08/04/11 2026

## 2011-08-04 NOTE — ED Notes (Signed)
Pt states fell yesterday outside. Pt hurting to left hip area.

## 2011-08-12 ENCOUNTER — Encounter (HOSPITAL_COMMUNITY): Payer: Self-pay | Admitting: *Deleted

## 2011-08-12 DIAGNOSIS — G8929 Other chronic pain: Secondary | ICD-10-CM | POA: Insufficient documentation

## 2011-08-12 DIAGNOSIS — F17200 Nicotine dependence, unspecified, uncomplicated: Secondary | ICD-10-CM | POA: Insufficient documentation

## 2011-08-12 DIAGNOSIS — Z7982 Long term (current) use of aspirin: Secondary | ICD-10-CM | POA: Insufficient documentation

## 2011-08-12 DIAGNOSIS — E119 Type 2 diabetes mellitus without complications: Secondary | ICD-10-CM | POA: Insufficient documentation

## 2011-08-12 DIAGNOSIS — Z79899 Other long term (current) drug therapy: Secondary | ICD-10-CM | POA: Insufficient documentation

## 2011-08-12 DIAGNOSIS — I252 Old myocardial infarction: Secondary | ICD-10-CM | POA: Insufficient documentation

## 2011-08-12 DIAGNOSIS — I10 Essential (primary) hypertension: Secondary | ICD-10-CM | POA: Insufficient documentation

## 2011-08-12 DIAGNOSIS — J4489 Other specified chronic obstructive pulmonary disease: Secondary | ICD-10-CM | POA: Insufficient documentation

## 2011-08-12 DIAGNOSIS — J449 Chronic obstructive pulmonary disease, unspecified: Secondary | ICD-10-CM | POA: Insufficient documentation

## 2011-08-12 MED ORDER — ALBUTEROL SULFATE (5 MG/ML) 0.5% IN NEBU
5.0000 mg | INHALATION_SOLUTION | Freq: Once | RESPIRATORY_TRACT | Status: AC
  Administered 2011-08-12: 5 mg via RESPIRATORY_TRACT
  Filled 2011-08-12: qty 1

## 2011-08-12 NOTE — ED Notes (Addendum)
Pt was coming here from [place] to see her sister who is in intensive care.  They kept getting lost and she became sob.  Pt with sats it 80's in waiting room.  Using inhaler.  Exp wheezes throughout.  Pt states she does not feel chest pain now, but she did earlier when she originally started having problems breathing.  Pt appears drowsy, but she states it is because she took her pain meds and xanex before she was woken up to go to the hospital.

## 2011-08-13 ENCOUNTER — Emergency Department (HOSPITAL_COMMUNITY): Payer: Medicare Other

## 2011-08-13 ENCOUNTER — Emergency Department (HOSPITAL_COMMUNITY)
Admission: EM | Admit: 2011-08-13 | Discharge: 2011-08-13 | Disposition: A | Discharge status: Home / Self Care | Payer: Medicare Other | Attending: Emergency Medicine | Admitting: Emergency Medicine

## 2011-08-13 DIAGNOSIS — J449 Chronic obstructive pulmonary disease, unspecified: Secondary | ICD-10-CM

## 2011-08-13 LAB — POCT I-STAT, CHEM 8
Calcium, Ion: 1.23 mmol/L (ref 1.13–1.30)
Glucose, Bld: 134 mg/dL — ABNORMAL HIGH (ref 70–99)
HCT: 42 % (ref 36.0–46.0)
Hemoglobin: 14.3 g/dL (ref 12.0–15.0)

## 2011-08-13 LAB — CBC
HCT: 41.3 % (ref 36.0–46.0)
MCHC: 32.7 g/dL (ref 30.0–36.0)
MCV: 85.2 fL (ref 78.0–100.0)
RDW: 13.7 % (ref 11.5–15.5)

## 2011-08-13 MED ORDER — ALBUTEROL SULFATE (5 MG/ML) 0.5% IN NEBU
5.0000 mg | INHALATION_SOLUTION | Freq: Once | RESPIRATORY_TRACT | Status: AC
  Administered 2011-08-13: 5 mg via RESPIRATORY_TRACT
  Filled 2011-08-13: qty 1

## 2011-08-13 MED ORDER — ALBUTEROL SULFATE HFA 108 (90 BASE) MCG/ACT IN AERS: 1.0000 | INHALATION_SPRAY | Freq: Four times a day (QID) | RESPIRATORY_TRACT | Status: AC | PRN

## 2011-08-13 MED ORDER — PREDNISONE 20 MG PO TABS: 40.0000 mg | ORAL_TABLET | Freq: Every day | ORAL | Status: AC

## 2011-08-13 MED ORDER — OXYCODONE-ACETAMINOPHEN 5-325 MG PO TABS
1.0000 | ORAL_TABLET | Freq: Once | ORAL | Status: AC
  Administered 2011-08-13: 1 via ORAL
  Filled 2011-08-13: qty 1

## 2011-08-13 MED ORDER — PREDNISONE 20 MG PO TABS
40.0000 mg | ORAL_TABLET | Freq: Once | ORAL | Status: AC
  Administered 2011-08-13: 40 mg via ORAL
  Filled 2011-08-13: qty 2

## 2011-08-13 NOTE — ED Notes (Signed)
MD at bedside. 

## 2011-08-13 NOTE — ED Notes (Signed)
Patient transported to X-ray 

## 2011-08-13 NOTE — ED Notes (Signed)
Pt to ED looking for room for sister that's in ICU; pt appears SOB, and audible wheezing- spoke with pt about importance of taking care of herself before going to see her sister d/t spo2 86-88% initially and HR 130's initially--used calming techniques and pt used own inhaler when trying to decided to be seen in ED--spo2 increased to 92-96% and HR 120's; pt agreed to be seen in ED prior to going to see her sister

## 2011-08-13 NOTE — ED Provider Notes (Addendum)
History     CSN: [id number]  Arrival date & time 08/12/11  2324   First MD Initiated Contact with Patient 08/13/11 0121      Chief Complaint  Patient presents with  . Shortness of Breath    (Consider location/radiation/quality/duration/timing/severity/associated sxs/prior treatment) HPI Comments: 61 year old female with a history of COPD who presents with a complaint of shortness of breath. She states that when she was at home she got a phone call from the hospital telling her that her sister was going to the intensive care unit. She became very upset and started hyperventilating and feeling very short of breath associated with wheezing. She denies significant coughing, fever, chills, chest pain or back pain. The symptoms were acute in onset, persistent, gradually improving and became much improved after taking an albuterol MDI dose as well as an albuterol nebulizer dose on arrival to the hospital. Currently the symptoms are mild  Patient is a 61 y.o. female presenting with shortness of breath. The history is provided by the patient, medical records and a relative.  Shortness of Breath  Associated symptoms include shortness of breath.    Past Medical History  Diagnosis Date  . COPD (chronic obstructive pulmonary disease)   . Asthma   . Diabetes mellitus   . MI (myocardial infarction)   . Hypertension   . Seizures   . Manic depressive disorder   . Chronic pain     Past Surgical History  Procedure Date  . Rotator cuff repair   . Breast cyst excision   . Cystectomy     left leg  . Tubal ligation   . Abdominal hysterectomy     Family History  Problem Relation Age of Onset  . Cancer Mother   . Stroke Father     History  Substance Use Topics  . Smoking status: Current Everyday Smoker -- 0.5 packs/day for 40 years    Types: Cigarettes  . Smokeless tobacco: Never Used  . Alcohol Use: No    OB History    Grav Para Term Preterm Abortions TAB SAB Ect Mult Living   4 4  4       4       Review of Systems  Respiratory: Positive for shortness of breath.   All other systems reviewed and are negative.    Allergies  Amitriptyline and Guaifenesin er  Home Medications   Current Outpatient Rx  Name Route Sig Dispense Refill  . ALBUTEROL SULFATE HFA 108 (90 BASE) MCG/ACT IN AERS Inhalation Inhale 1-2 puffs into the lungs every 6 (six) hours as needed for wheezing. 1 Inhaler 2  . ALPRAZOLAM 2 MG PO TABS Oral Take 2 mg by mouth 3 (three) times daily.    Marland Kitchen AMLODIPINE BESYLATE 5 MG PO TABS Oral Take 5 mg by mouth daily.    . ASPIRIN EC 81 MG PO TBEC Oral Take 81 mg by mouth daily.    Marland Kitchen FLUTICASONE PROPIONATE  HFA 220 MCG/ACT IN AERO Inhalation Inhale 1 puff into the lungs daily as needed.    . FUROSEMIDE 40 MG PO TABS Oral Take 20-40 mg by mouth daily. Patient takes 1 tablet in the morning and 1/2 tablet every afternoon    . IBUPROFEN-DIPHENHYDRAMINE CIT 200-38 MG PO TABS Oral Take 1 tablet by mouth at bedtime as needed. Sleep    . LOSARTAN POTASSIUM 100 MG PO TABS Oral Take 100 mg by mouth daily.    Marland Kitchen METFORMIN HCL 500 MG PO TABS Oral Take 500  mg by mouth daily.    Marland Kitchen METHOCARBAMOL 500 MG PO TABS  Take 1 or 2 po TID for muscle soreness 60 tablet 0  . MONTELUKAST SODIUM 10 MG PO TABS Oral Take 10 mg by mouth at bedtime.    . CENTRUM PO Oral Take 1 tablet by mouth daily.    . OMEGA-3-ACID ETHYL ESTERS 1 G PO CAPS Oral Take 1 g by mouth daily.    Marland Kitchen OMEPRAZOLE 20 MG PO CPDR Oral Take 20 mg by mouth 2 (two) times daily.    . OXYCODONE HCL 15 MG PO TABS Oral Take 15 mg by mouth every 6 (six) hours as needed. Pain    . PERCOCET 5-325 MG PO TABS Oral Take 1 tablet by mouth every 6 (six) hours as needed for pain. 15 tablet 0    Dispense as written.  . ROFLUMILAST 500 MCG PO TABS Oral Take 500 mcg by mouth daily.    Marland Kitchen TIOTROPIUM BROMIDE MONOHYDRATE 18 MCG IN CAPS Inhalation Place 18 mcg into inhaler and inhale daily.    Marland Kitchen VITAMIN E 400 UNITS PO CAPS Oral Take 400 Units  by mouth daily.    . ALBUTEROL SULFATE HFA 108 (90 BASE) MCG/ACT IN AERS Inhalation Inhale 1-2 puffs into the lungs every 6 (six) hours as needed for wheezing. 1 Inhaler 0  . PREDNISONE 20 MG PO TABS Oral Take 2 tablets (40 mg total) by mouth daily. 10 tablet 0    BP 120/52  Pulse 122  Resp 26  Ht 5' 5.5" (1.664 m)  Wt 145 lb (65.772 kg)  BMI 23.76 kg/m2  SpO2 98%  Physical Exam  Nursing note and vitals reviewed. Constitutional: She appears well-developed and well-nourished. No distress.  HENT:  Head: Normocephalic and atraumatic.  Mouth/Throat: Oropharynx is clear and moist. No oropharyngeal exudate.  Eyes: Conjunctivae and EOM are normal. Pupils are equal, round, and reactive to light. Right eye exhibits no discharge. Left eye exhibits no discharge. No scleral icterus.  Neck: Normal range of motion. Neck supple. No JVD present. No thyromegaly present.  Cardiovascular: Normal rate, regular rhythm, normal heart sounds and intact distal pulses.  Exam reveals no gallop and no friction rub.   No murmur heard. Pulmonary/Chest: Effort normal. She has wheezes. She has no rales.       Slight tachypnea, diffuse expiratory wheezing, speaks in full sentences, no increased work of breathing, no accessory muscle use.  Abdominal: Soft. Bowel sounds are normal. She exhibits no distension and no mass. There is no tenderness.  Musculoskeletal: Normal range of motion. She exhibits no edema and no tenderness.  Lymphadenopathy:    She has no cervical adenopathy.  Neurological: She is alert. Coordination normal.  Skin: Skin is warm and dry. No rash noted. No erythema.  Psychiatric: She has a normal mood and affect. Her behavior is normal.    ED Course  Procedures (including critical care time)  Labs Reviewed  POCT I-STAT, CHEM 8 - Abnormal; Notable for the following:    Glucose, Bld 134 (*)     All other components within normal limits  CBC  POCT I-STAT TROPONIN I   Dg Chest 2  View  08/13/2011  *RADIOLOGY REPORT*  Clinical Data: Shortness of breath.  End expiratory wheezes.  CHEST - 2 VIEW  Comparison: 08/04/2011  Findings: Normal heart size and pulmonary vascularity. Emphysematous changes and scattered fibrosis in the lungs.  No focal airspace consolidation.  No blunting of costophrenic angles. No pneumothorax.  Degenerative  changes in the spine and shoulders. No significant changes since the previous study.  IMPRESSION: Emphysematous changes and scattered fibrosis in the lungs.  No focal consolidation.  Original Report Authenticated By: Marlon Pel, M.D.     1. COPD (chronic obstructive pulmonary disease)       MDM  Vital signs reveal an oxygen saturation of 98% on minimal supplemental oxygen, she is not in any distress and is not tachycardic , she has no fever and has normal blood pressure. At this time we'll treat the patient with another nebulized albuterol as well as prednisone by mouth.  Labs reviewed showing no significant abnormalities of her electrolytes renal function or blood counts. She has a negative troponin and a chest x-ray which shows findings consistent with emphysema and scattered fibrosis. The patient is nontoxic in appearance.   IMproved after meds, pt stable for d/c  Discharge Prescriptions include:  Albuterol MDI Prednisone     Vida Roller, MD 08/13/11 0226  ED ECG REPORT  I personally interpreted this EKG   Date: 08/13/11  12:06 AM  Rate: 123  Rhythm: sinus tachycardia  QRS Axis: normal  Intervals: normal  ST/T Wave abnormalities: normal  Conduction Disutrbances:none  Narrative Interpretation:   Old EKG Reviewed: Since 01/24/11, rate faster and poor R wave progression no longer seen   Vida Roller, MD 09/16/11 [phone]

## 2011-10-11 ENCOUNTER — Inpatient Hospital Stay (HOSPITAL_COMMUNITY)
Admission: EM | Admit: 2011-10-11 | Discharge: 2011-10-13 | DRG: 192 | Disposition: A | Discharge status: Home / Self Care | Payer: Medicare Other | Attending: Internal Medicine | Admitting: Internal Medicine

## 2011-10-11 ENCOUNTER — Emergency Department (HOSPITAL_COMMUNITY): Payer: Medicare Other

## 2011-10-11 ENCOUNTER — Encounter (HOSPITAL_COMMUNITY): Payer: Self-pay

## 2011-10-11 DIAGNOSIS — Z90710 Acquired absence of both cervix and uterus: Secondary | ICD-10-CM

## 2011-10-11 DIAGNOSIS — J449 Chronic obstructive pulmonary disease, unspecified: Secondary | ICD-10-CM

## 2011-10-11 DIAGNOSIS — R259 Unspecified abnormal involuntary movements: Secondary | ICD-10-CM | POA: Diagnosis present

## 2011-10-11 DIAGNOSIS — I252 Old myocardial infarction: Secondary | ICD-10-CM

## 2011-10-11 DIAGNOSIS — J441 Chronic obstructive pulmonary disease with (acute) exacerbation: Secondary | ICD-10-CM | POA: Diagnosis present

## 2011-10-11 DIAGNOSIS — F1320 Sedative, hypnotic or anxiolytic dependence, uncomplicated: Secondary | ICD-10-CM | POA: Diagnosis present

## 2011-10-11 DIAGNOSIS — F319 Bipolar disorder, unspecified: Secondary | ICD-10-CM | POA: Diagnosis present

## 2011-10-11 DIAGNOSIS — R569 Unspecified convulsions: Secondary | ICD-10-CM | POA: Diagnosis present

## 2011-10-11 DIAGNOSIS — J019 Acute sinusitis, unspecified: Secondary | ICD-10-CM | POA: Diagnosis present

## 2011-10-11 DIAGNOSIS — F1120 Opioid dependence, uncomplicated: Secondary | ICD-10-CM | POA: Diagnosis present

## 2011-10-11 DIAGNOSIS — IMO0002 Reserved for concepts with insufficient information to code with codable children: Secondary | ICD-10-CM

## 2011-10-11 DIAGNOSIS — J45909 Unspecified asthma, uncomplicated: Secondary | ICD-10-CM

## 2011-10-11 DIAGNOSIS — F17200 Nicotine dependence, unspecified, uncomplicated: Secondary | ICD-10-CM | POA: Diagnosis present

## 2011-10-11 DIAGNOSIS — Z23 Encounter for immunization: Secondary | ICD-10-CM

## 2011-10-11 DIAGNOSIS — J440 Chronic obstructive pulmonary disease with acute lower respiratory infection: Principal | ICD-10-CM | POA: Diagnosis present

## 2011-10-11 DIAGNOSIS — Z7982 Long term (current) use of aspirin: Secondary | ICD-10-CM

## 2011-10-11 DIAGNOSIS — E119 Type 2 diabetes mellitus without complications: Secondary | ICD-10-CM | POA: Diagnosis present

## 2011-10-11 DIAGNOSIS — I10 Essential (primary) hypertension: Secondary | ICD-10-CM | POA: Diagnosis present

## 2011-10-11 DIAGNOSIS — Z888 Allergy status to other drugs, medicaments and biological substances status: Secondary | ICD-10-CM

## 2011-10-11 DIAGNOSIS — Z79899 Other long term (current) drug therapy: Secondary | ICD-10-CM

## 2011-10-11 DIAGNOSIS — F411 Generalized anxiety disorder: Secondary | ICD-10-CM | POA: Diagnosis present

## 2011-10-11 DIAGNOSIS — J209 Acute bronchitis, unspecified: Principal | ICD-10-CM | POA: Diagnosis present

## 2011-10-11 DIAGNOSIS — G8929 Other chronic pain: Secondary | ICD-10-CM | POA: Diagnosis present

## 2011-10-11 LAB — BASIC METABOLIC PANEL
Calcium: 10.2 mg/dL (ref 8.4–10.5)
Creatinine, Ser: 1.14 mg/dL — ABNORMAL HIGH (ref 0.50–1.10)
GFR calc non Af Amer: 50 mL/min — ABNORMAL LOW (ref >90)
Glucose, Bld: 107 mg/dL — ABNORMAL HIGH (ref 70–99)
Sodium: 133 mEq/L — ABNORMAL LOW (ref 135–145)

## 2011-10-11 LAB — CBC WITH DIFFERENTIAL/PLATELET
Eosinophils Absolute: 0 10*3/uL (ref 0.0–0.7)
Eosinophils Relative: 0 % (ref 0–5)
HCT: 40 % (ref 36.0–46.0)
Lymphs Abs: 1.5 10*3/uL (ref 0.7–4.0)
MCH: 27.9 pg (ref 26.0–34.0)
MCV: 79.7 fL (ref 78.0–100.0)
Monocytes Absolute: 0.7 10*3/uL (ref 0.1–1.0)
Platelets: 309 10*3/uL (ref 150–400)
RBC: 5.02 MIL/uL (ref 3.87–5.11)
RDW: 13.8 % (ref 11.5–15.5)

## 2011-10-11 MED ORDER — LORAZEPAM 2 MG/ML IJ SOLN
1.0000 mg | Freq: Once | INTRAMUSCULAR | Status: AC
Start: 2011-10-11 — End: 2011-10-11
  Administered 2011-10-11: 1 mg via INTRAVENOUS
  Filled 2011-10-11: qty 1

## 2011-10-11 MED ORDER — ROFLUMILAST 500 MCG PO TABS
500.0000 ug | ORAL_TABLET | Freq: Every day | ORAL | Status: DC
  Administered 2011-10-12 – 2011-10-13 (×2): 500 ug via ORAL
  Filled 2011-10-11 (×4): qty 1

## 2011-10-11 MED ORDER — BENZONATATE 100 MG PO CAPS: 100.0000 mg | ORAL_CAPSULE | Freq: Three times a day (TID) | ORAL | Status: DC | PRN

## 2011-10-11 MED ORDER — IPRATROPIUM BROMIDE 0.02 % IN SOLN
0.5000 mg | Freq: Four times a day (QID) | RESPIRATORY_TRACT | Status: DC
  Administered 2011-10-12 – 2011-10-13 (×7): 0.5 mg via RESPIRATORY_TRACT
  Filled 2011-10-11 (×8): qty 2.5

## 2011-10-11 MED ORDER — ASPIRIN EC 81 MG PO TBEC
81.0000 mg | DELAYED_RELEASE_TABLET | Freq: Every day | ORAL | Status: DC
  Administered 2011-10-12 – 2011-10-13 (×2): 81 mg via ORAL
  Filled 2011-10-11 (×2): qty 1

## 2011-10-11 MED ORDER — NICOTINE 14 MG/24HR TD PT24
14.0000 mg | MEDICATED_PATCH | Freq: Every day | TRANSDERMAL | Status: DC
  Administered 2011-10-11 – 2011-10-13 (×3): 14 mg via TRANSDERMAL
  Filled 2011-10-11 (×3): qty 1

## 2011-10-11 MED ORDER — ALBUTEROL SULFATE (5 MG/ML) 0.5% IN NEBU
2.5000 mg | INHALATION_SOLUTION | Freq: Once | RESPIRATORY_TRACT | Status: AC
  Administered 2011-10-11: 2.5 mg via RESPIRATORY_TRACT
  Filled 2011-10-11: qty 0.5

## 2011-10-11 MED ORDER — LEVALBUTEROL HCL 0.63 MG/3ML IN NEBU
0.6300 mg | INHALATION_SOLUTION | RESPIRATORY_TRACT | Status: DC
  Administered 2011-10-11 – 2011-10-12 (×3): 0.63 mg via RESPIRATORY_TRACT
  Filled 2011-10-11 (×5): qty 3

## 2011-10-11 MED ORDER — HYDROMORPHONE HCL PF 1 MG/ML IJ SOLN
1.0000 mg | INTRAMUSCULAR | Status: DC | PRN
  Administered 2011-10-11 – 2011-10-13 (×7): 1 mg via INTRAVENOUS
  Filled 2011-10-11 (×7): qty 1

## 2011-10-11 MED ORDER — OXYCODONE HCL 5 MG PO TABS
15.0000 mg | ORAL_TABLET | Freq: Four times a day (QID) | ORAL | Status: DC | PRN
  Administered 2011-10-11 – 2011-10-13 (×5): 15 mg via ORAL
  Filled 2011-10-11 (×6): qty 3

## 2011-10-11 MED ORDER — INSULIN ASPART 100 UNIT/ML ~~LOC~~ SOLN: 0.0000 [IU] | Freq: Every day | SUBCUTANEOUS | Status: DC

## 2011-10-11 MED ORDER — ALBUTEROL (5 MG/ML) CONTINUOUS INHALATION SOLN
INHALATION_SOLUTION | RESPIRATORY_TRACT | Status: AC
  Administered 2011-10-11: 10 mg via RESPIRATORY_TRACT
  Filled 2011-10-11: qty 20

## 2011-10-11 MED ORDER — ALBUTEROL SULFATE (5 MG/ML) 0.5% IN NEBU
INHALATION_SOLUTION | RESPIRATORY_TRACT | Status: AC
  Administered 2011-10-11: 2.5 mg via RESPIRATORY_TRACT
  Filled 2011-10-11: qty 0.5

## 2011-10-11 MED ORDER — LEVOFLOXACIN 500 MG PO TABS
500.0000 mg | ORAL_TABLET | Freq: Every day | ORAL | Status: DC
  Administered 2011-10-12 – 2011-10-13 (×2): 500 mg via ORAL
  Filled 2011-10-11 (×2): qty 1

## 2011-10-11 MED ORDER — METHYLPREDNISOLONE SODIUM SUCC 125 MG IJ SOLR
60.0000 mg | Freq: Two times a day (BID) | INTRAMUSCULAR | Status: DC
  Administered 2011-10-11 – 2011-10-12 (×2): 60 mg via INTRAVENOUS
  Filled 2011-10-11 (×2): qty 2

## 2011-10-11 MED ORDER — IPRATROPIUM BROMIDE 0.02 % IN SOLN
RESPIRATORY_TRACT | Status: AC
  Administered 2011-10-11: 0.5 mg via RESPIRATORY_TRACT
  Filled 2011-10-11: qty 2.5

## 2011-10-11 MED ORDER — METHYLPREDNISOLONE SODIUM SUCC 125 MG IJ SOLR
125.0000 mg | Freq: Once | INTRAMUSCULAR | Status: AC
  Administered 2011-10-11: 125 mg via INTRAVENOUS
  Filled 2011-10-11: qty 2

## 2011-10-11 MED ORDER — BUDESONIDE-FORMOTEROL FUMARATE 160-4.5 MCG/ACT IN AERO
2.0000 | INHALATION_SPRAY | Freq: Two times a day (BID) | RESPIRATORY_TRACT | Status: DC
  Administered 2011-10-11 – 2011-10-13 (×4): 2 via RESPIRATORY_TRACT
  Filled 2011-10-11: qty 6

## 2011-10-11 MED ORDER — IPRATROPIUM BROMIDE 0.02 % IN SOLN
0.5000 mg | Freq: Once | RESPIRATORY_TRACT | Status: AC
  Administered 2011-10-11: 0.5 mg via RESPIRATORY_TRACT
  Filled 2011-10-11: qty 2.5

## 2011-10-11 MED ORDER — ALBUTEROL (5 MG/ML) CONTINUOUS INHALATION SOLN
10.0000 mg/h | INHALATION_SOLUTION | RESPIRATORY_TRACT | Status: DC
  Administered 2011-10-11: 10 mg/h via RESPIRATORY_TRACT

## 2011-10-11 MED ORDER — ONDANSETRON HCL 4 MG PO TABS: 4.0000 mg | ORAL_TABLET | Freq: Four times a day (QID) | ORAL | Status: DC | PRN

## 2011-10-11 MED ORDER — MONTELUKAST SODIUM 10 MG PO TABS
10.0000 mg | ORAL_TABLET | Freq: Every day | ORAL | Status: DC
  Administered 2011-10-11 – 2011-10-12 (×2): 10 mg via ORAL
  Filled 2011-10-11 (×2): qty 1

## 2011-10-11 MED ORDER — ONDANSETRON HCL 4 MG/2ML IJ SOLN: 4.0000 mg | Freq: Four times a day (QID) | INTRAMUSCULAR | Status: DC | PRN

## 2011-10-11 MED ORDER — ALUM & MAG HYDROXIDE-SIMETH 200-200-20 MG/5ML PO SUSP: 30.0000 mL | Freq: Four times a day (QID) | ORAL | Status: DC | PRN

## 2011-10-11 MED ORDER — HYDROMORPHONE HCL PF 1 MG/ML IJ SOLN
1.0000 mg | Freq: Once | INTRAMUSCULAR | Status: AC
  Administered 2011-10-11: 1 mg via INTRAVENOUS
  Filled 2011-10-11: qty 1

## 2011-10-11 MED ORDER — PANTOPRAZOLE SODIUM 40 MG PO TBEC
40.0000 mg | DELAYED_RELEASE_TABLET | Freq: Every day | ORAL | Status: DC
  Administered 2011-10-11 – 2011-10-13 (×3): 40 mg via ORAL
  Filled 2011-10-11 (×3): qty 1

## 2011-10-11 MED ORDER — INSULIN ASPART 100 UNIT/ML ~~LOC~~ SOLN
0.0000 [IU] | Freq: Three times a day (TID) | SUBCUTANEOUS | Status: DC
  Administered 2011-10-12 (×2): 2 [IU] via SUBCUTANEOUS

## 2011-10-11 MED ORDER — ENOXAPARIN SODIUM 40 MG/0.4ML ~~LOC~~ SOLN
40.0000 mg | SUBCUTANEOUS | Status: DC
  Administered 2011-10-11 – 2011-10-12 (×2): 40 mg via SUBCUTANEOUS
  Filled 2011-10-11 (×2): qty 0.4

## 2011-10-11 MED ORDER — SODIUM CHLORIDE 0.9 % IV SOLN
INTRAVENOUS | Status: DC
  Administered 2011-10-11 – 2011-10-12 (×2): via INTRAVENOUS

## 2011-10-11 MED ORDER — KETOROLAC TROMETHAMINE 30 MG/ML IJ SOLN
30.0000 mg | Freq: Once | INTRAMUSCULAR | Status: AC
  Administered 2011-10-11: 30 mg via INTRAVENOUS
  Filled 2011-10-11: qty 1

## 2011-10-11 MED ORDER — ALPRAZOLAM 1 MG PO TABS
1.0000 mg | ORAL_TABLET | Freq: Three times a day (TID) | ORAL | Status: DC
  Administered 2011-10-11 – 2011-10-12 (×2): 1 mg via ORAL
  Filled 2011-10-11 (×2): qty 1

## 2011-10-11 MED ORDER — TEARS RENEWED OP SOLN
1.0000 [drp] | Freq: Three times a day (TID) | OPHTHALMIC | Status: DC | PRN
  Filled 2011-10-11: qty 15

## 2011-10-11 MED ORDER — ACETAMINOPHEN 650 MG RE SUPP: 650.0000 mg | Freq: Four times a day (QID) | RECTAL | Status: DC | PRN

## 2011-10-11 MED ORDER — ACETAMINOPHEN 325 MG PO TABS
650.0000 mg | ORAL_TABLET | Freq: Four times a day (QID) | ORAL | Status: DC | PRN
  Administered 2011-10-11: 650 mg via ORAL
  Filled 2011-10-11: qty 2

## 2011-10-11 MED ORDER — BUDESONIDE-FORMOTEROL FUMARATE 160-4.5 MCG/ACT IN AERO
INHALATION_SPRAY | RESPIRATORY_TRACT | Status: AC
  Filled 2011-10-11: qty 6

## 2011-10-11 MED ORDER — FLUTICASONE PROPIONATE 50 MCG/ACT NA SUSP
2.0000 | Freq: Every day | NASAL | Status: DC
  Administered 2011-10-12 – 2011-10-13 (×2): 2 via NASAL
  Filled 2011-10-11: qty 16

## 2011-10-11 NOTE — ED Notes (Signed)
Pt requesting something for pain, Dr. Cook notified, additional orders given  

## 2011-10-11 NOTE — ED Notes (Signed)
Pt c/o sob, cough, wheezing, was seen by pcp yesterday, started on oxygen, antibiotics, steroids, pt states that she is not any better. Pt has audible wheezing noted on arrival to tx room,

## 2011-10-11 NOTE — ED Notes (Signed)
Pt states that she has been out of her pain medications and xanax since September 27,

## 2011-10-11 NOTE — ED Notes (Signed)
Pt reports bing dx w/ pneumonia yesterday and not getting any better

## 2011-10-11 NOTE — ED Provider Notes (Signed)
History  This chart was scribed for Donnetta Hutching, MD by Ladona Ridgel Day. This patient was seen in room APA14/APA14 and the patient's care was started at 1244.   CSN: [id number]  Arrival date & time 10/11/11  1244   First MD Initiated Contact with Patient 10/11/11 1314      Chief Complaint  Patient presents with  . Cough  . Nasal Congestion  . Shortness of Breath   Patient is a 61 y.o. female presenting with shortness of breath. The history is provided by the patient. No language interpreter was used.  Shortness of Breath  The current episode started 5 to 7 days ago. The onset was gradual. The problem occurs continuously. The problem has been gradually worsening. The problem is moderate. Nothing relieves the symptoms. Associated symptoms include shortness of breath. Pertinent negatives include no fever.   Monica Stuart is a 61 y.o. female who presents to the Emergency Department complaining of constant gradually worsening SOB/wheezing after recent diagnosis of pneumonia and not getting any better over the past week. She has been using a nebulizer at home without improvement in symptoms. She is a smoker.   Her PCP is Dr. Aniceto Boss in [place]  Past Medical History  Diagnosis Date  . COPD (chronic obstructive pulmonary disease)   . Asthma   . Diabetes mellitus   . MI (myocardial infarction)   . Hypertension   . Seizures   . Manic depressive disorder   . Chronic pain     Past Surgical History  Procedure Date  . Rotator cuff repair   . Breast cyst excision   . Cystectomy     left leg  . Tubal ligation   . Abdominal hysterectomy     Family History  Problem Relation Age of Onset  . Cancer Mother   . Stroke Father     History  Substance Use Topics  . Smoking status: Current Every Day Smoker -- 0.5 packs/day for 40 years    Types: Cigarettes  . Smokeless tobacco: Never Used  . Alcohol Use: No    OB History    Grav Para Term Preterm Abortions TAB SAB Ect Mult Living   4 4 4       4       Review of Systems  Constitutional: Negative for fever.  Respiratory: Positive for shortness of breath.   Gastrointestinal: Negative for nausea and vomiting.  Neurological: Negative for weakness.  All other systems reviewed and are negative.    Allergies  Amitriptyline and Guaifenesin er  Home Medications   Current Outpatient Rx  Name Route Sig Dispense Refill  . ALBUTEROL SULFATE HFA 108 (90 BASE) MCG/ACT IN AERS Inhalation Inhale 1-2 puffs into the lungs every 6 (six) hours as needed for wheezing. 1 Inhaler 2  . ALBUTEROL SULFATE HFA 108 (90 BASE) MCG/ACT IN AERS Inhalation Inhale 1-2 puffs into the lungs every 6 (six) hours as needed for wheezing. 1 Inhaler 0  . ALPRAZOLAM 2 MG PO TABS Oral Take 2 mg by mouth 3 (three) times daily.    Marland Kitchen AMLODIPINE BESYLATE 5 MG PO TABS Oral Take 5 mg by mouth daily.    . ASPIRIN EC 81 MG PO TBEC Oral Take 81 mg by mouth daily.    Marland Kitchen FLUTICASONE PROPIONATE  HFA 220 MCG/ACT IN AERO Inhalation Inhale 1 puff into the lungs daily as needed.    . FUROSEMIDE 40 MG PO TABS Oral Take 20-40 mg by mouth daily. Patient takes 1 tablet  in the morning and 1/2 tablet every afternoon    . IBUPROFEN-DIPHENHYDRAMINE CIT 200-38 MG PO TABS Oral Take 1 tablet by mouth at bedtime as needed. Sleep    . LOSARTAN POTASSIUM 100 MG PO TABS Oral Take 100 mg by mouth daily.    Marland Kitchen METFORMIN HCL 500 MG PO TABS Oral Take 500 mg by mouth daily.    Marland Kitchen METHOCARBAMOL 500 MG PO TABS  Take 1 or 2 po TID for muscle soreness 60 tablet 0  . MONTELUKAST SODIUM 10 MG PO TABS Oral Take 10 mg by mouth at bedtime.    . CENTRUM PO Oral Take 1 tablet by mouth daily.    . OMEGA-3-ACID ETHYL ESTERS 1 G PO CAPS Oral Take 1 g by mouth daily.    Marland Kitchen OMEPRAZOLE 20 MG PO CPDR Oral Take 20 mg by mouth 2 (two) times daily.    . OXYCODONE HCL 15 MG PO TABS Oral Take 15 mg by mouth every 6 (six) hours as needed. Pain    . ROFLUMILAST 500 MCG PO TABS Oral Take 500 mcg by mouth daily.    Marland Kitchen  TIOTROPIUM BROMIDE MONOHYDRATE 18 MCG IN CAPS Inhalation Place 18 mcg into inhaler and inhale daily.    Marland Kitchen VITAMIN E 400 UNITS PO CAPS Oral Take 400 Units by mouth daily.      Triage vitals: BP 147/59  Pulse 114  Temp 97.4 F (36.3 C) (Oral)  Resp 26  Ht 5\' 6"  (1.676 m)  Wt 146 lb (66.225 kg)  BMI 23.56 kg/m2  SpO2 96%  Physical Exam  Nursing note and vitals reviewed. Constitutional: She is oriented to person, place, and time. She appears well-developed and well-nourished.  HENT:  Head: Normocephalic and atraumatic.  Eyes: Conjunctivae normal and EOM are normal. Pupils are equal, round, and reactive to light.  Neck: Normal range of motion. Neck supple.  Cardiovascular: Normal rate, regular rhythm and normal heart sounds.   Pulmonary/Chest: Effort normal. She has wheezes (expiratory wheezing).  Abdominal: Soft. Bowel sounds are normal.  Musculoskeletal: Normal range of motion.  Neurological: She is alert and oriented to person, place, and time.  Skin: Skin is warm and dry.  Psychiatric: She has a normal mood and affect.    ED Course  Procedures (including critical care time) DIAGNOSTIC STUDIES: Oxygen Saturation is 96% on room air, adequate by my interpretation.    COORDINATION OF CARE: At 115 PM Discussed treatment plan with patient which includes CXR, blood work, breathing treatment. Patient agrees.   Labs Reviewed  BASIC METABOLIC PANEL - Abnormal; Notable for the following:    Sodium 133 (*)     Chloride 91 (*)     Glucose, Bld 107 (*)     Creatinine, Ser 1.14 (*)     GFR calc non Af Amer 50 (*)     GFR calc Af Amer 58 (*)     All other components within normal limits  CBC WITH DIFFERENTIAL   Dg Chest Portable 1 View  10/11/2011  *RADIOLOGY REPORT*  Clinical Data: Cough, nasal congestion, shortness of breath  PORTABLE CHEST - 1 VIEW  Comparison: 08/13/2011  Findings: Heart size and vascular pattern are normal.  No infiltrate or effusion.  Mild scarring or  atelectasis in the left lower lobe is stable.  Hyperinflation suggests an element of COPD.  IMPRESSION: No acute findings.   Original Report Authenticated By: Otilio Carpen, M.D.      No diagnosis found.   Date: 10/11/2011  Rate: 98  Rhythm: normal sinus rhythm  QRS Axis: normal  Intervals: normal  ST/T Wave abnormalities: normal  Conduction Disutrbances: none  Narrative Interpretation: unremarkable  CRITICAL CARE Performed by: Donnetta Hutching   Total critical care time: 30  Critical care time was exclusive of separately billable procedures and treating other patients.  Critical care was necessary to treat or prevent imminent or life-threatening deterioration.  Critical care was time spent personally by me on the following activities: development of treatment plan with patient and/or surrogate as well as nursing, discussions with consultants, evaluation of patient's response to treatment, examination of patient, obtaining history from patient or surrogate, ordering and performing treatments and interventions, ordering and review of laboratory studies, ordering and review of radiographic studies, pulse oximetry and re-evaluation of patient's condition.   MDM   Long-standing COPD with persistent  asthma attack. Patient required continuous nebulization. IV Solu Medrol given. Chest x-ray shows no acute findings. Admit to hospitalist.     I personally performed the services described in this documentation, which was scribed in my presence. The recorded information has been reviewed and considered.           Donnetta Hutching, MD 10/11/11 [phone]

## 2011-10-11 NOTE — ED Notes (Signed)
Pt repositioned in bed, adult depends changed per pt request, additional warm blankets given per request, pt and family updated on plan of care.

## 2011-10-11 NOTE — ED Notes (Signed)
Dr. Sullivan at bedside

## 2011-10-11 NOTE — ED Notes (Signed)
Pt given ice chips and mouth swabs per request. Pt and family updated on plan of care

## 2011-10-11 NOTE — H&P (Signed)
Hospital Admission Note Date: 10/11/2011  Patient name: Monica Stuart Medical record number: [id number] Date of birth: 1950-03-29 Age: 61 y.o. Gender: female PCP:   Attending physician: Christiane Ha, MD  Chief Complaint: shortness of breath  History of Present Illness:  Monica Stuart is an 61 y.o. female who presents with dyspnea, cough, sinus congestion, post nasal drip, weakness.  She went to her primary care provider yesterday and was started on levofloxacin and prednisone. She continued to feel bad and therefore came to the emergency room. According to the ED provider, her lung sounds were very diminished and she was not moving much air on arrival. She has received IV steroids, bronchodilators and oxygen. She feels slightly better but still short of breath. She has had a sore throat. Poor appetite. She reports that a family member stool her pain and anxiety medications. Since then, she's been very tremulous, had vomiting and diarrhea. She thinks she may have had a seizure because she woke up the other day and had bitten the inside of her right cheek. She has a previous history of seizures. She has also received IV opiates and benzodiazepines in the emergency room. She denies alcohol abuse or medication misuse. She continues to smoke a half a pack of cigarettes a day but is trying to quit with the assistance of a electronic cigarettes.  Past Medical History  Diagnosis Date  . COPD (chronic obstructive pulmonary disease)   . Asthma   . Diabetes mellitus   . MI (myocardial infarction)   . Hypertension   . Seizures   . Manic depressive disorder   . Chronic pain     Meds: See medicine reconciliation  Allergies: Amitriptyline and Guaifenesin er History   Social History  . Marital Status: Legally Separated    Spouse Name: N/A    Number of Children: N/A  . Years of Education: N/A   Occupational History  . Not on file.   Social History Main Topics  . Smoking status: Current  Every Day Smoker -- 0.5 packs/day for 40 years    Types: Cigarettes  . Smokeless tobacco: Never Used  . Alcohol Use: No  . Drug Use: No  . Sexually Active: No   Other Topics Concern  . Not on file   Social History Narrative  . No narrative on file   Family History  Problem Relation Age of Onset  . Cancer Mother   . Stroke Father    Past Surgical History  Procedure Date  . Rotator cuff repair   . Breast cyst excision   . Cystectomy     left leg  . Tubal ligation   . Abdominal hysterectomy     Review of Systems: Systems reviewed and as per HPI, otherwise negative.  Physical Exam: Blood pressure 124/70, pulse 108, temperature 97.4 F (36.3 C), temperature source Oral, resp. rate 20, height 5\' 6"  (1.676 m), weight 66.225 kg (146 lb), SpO2 100.00%. BP 124/70  Pulse 108  Temp 97.4 F (36.3 C) (Oral)  Resp 20  Ht 5\' 6"  (1.676 m)  Wt 66.225 kg (146 lb)  BMI 23.56 kg/m2  SpO2 100%  General Appearance:    anxious and tremulous currently on nebulizer   Head:    Normocephalic, without obvious abnormality, atraumatic  Eyes:    PERRL, conjunctiva/corneas clear, EOM's intact, fundi    benign, both eyes  Ears:    Normal TM's and external ear canals, both ears  Nose:   Nares normal, septum  midline, mucosa normal, no drainage    or bilateral maxillary sinus tenderness   Throat:   dry mucous membranes. Oropharynx without erythema or exudate.   Neck:   Supple, symmetrical, trachea midline, no adenopathy;    thyroid:  no enlargement/tenderness/nodules; no carotid   bruit or JVD  Back:     Symmetric, no curvature, ROM normal, no CVA tenderness  Lungs:     rhonchi and bilateral wheeze   Chest Wall:    No tenderness or deformity   Heart:    Regular rate and rhythm, S1 and S2 normal, no murmur, rub   or gallop  Breast Exam:    deferred  Abdomen:     Soft, non-tender, bowel sounds active all four quadrants,    no masses, no organomegaly  Genitalia:   deferred  Rectal:   deferred    Extremities:   Extremities normal, atraumatic, no cyanosis or edema  Pulses:   2+ and symmetric all extremities  Skin:   Skin color, texture, turgor normal, no rashes or lesions  Lymph nodes:   Cervical, supraclavicular, and axillary nodes normal  Neurologic:   CNII-XII intact, normal strength, sensation and reflexes    throughout    Psychiatric: Anxious. Cooperative. Oriented.  Lab results: Basic Metabolic Panel:  Basename 10/11/11 1256  NA 133*  K 4.2  CL 91*  CO2 28  GLUCOSE 107*  BUN 22  CREATININE 1.14*  CALCIUM 10.2  MG --  PHOS --   Liver Function Tests: No results found for this basename: AST:2,ALT:2,ALKPHOS:2,BILITOT:2,PROT:2,ALBUMIN:2 in the last 72 hours No results found for this basename: LIPASE:2,AMYLASE:2 in the last 72 hours No results found for this basename: AMMONIA:2 in the last 72 hours CBC:  Basename 10/11/11 1256  WBC 7.4  NEUTROABS 5.2  HGB 14.0  HCT 40.0  MCV 79.7  PLT 309   Imaging results:  Dg Chest Portable 1 View  10/11/2011  *RADIOLOGY REPORT*  Clinical Data: Cough, nasal congestion, shortness of breath  PORTABLE CHEST - 1 VIEW  Comparison: 08/13/2011  Findings: Heart size and vascular pattern are normal.  No infiltrate or effusion.  Mild scarring or atelectasis in the left lower lobe is stable.  Hyperinflation suggests an element of COPD.  IMPRESSION: No acute findings.   Original Report Authenticated By: Otilio Carpen, M.D.     Assessment & Plan: Principal Problem:  *COPD exacerbation Active Problems:  Acute sinusitis  Acute bronchitis  Opiate dependence  Benzodiazepine dependence  DM type 2 (diabetes mellitus, type 2)  Benign hypertension  Will admit. Continue steroids, oxygen, bronchodilators, antibiotics. Add Flonase. Resume benzodiazepines and opiates to avoid further withdrawal. Hold antihypertensives for now. Monitor blood glucose and sliding scale insulin for now. Nicotine patch. Encouraged patient to continue her efforts  to quit smoking.  Dagon Budai L 10/11/2011, 4:52 PM

## 2011-10-11 NOTE — ED Notes (Signed)
Pt requesting pain medication for her back, prn pain medication given as ordered.

## 2011-10-11 NOTE — ED Notes (Signed)
Dr Cook at bedside,  

## 2011-10-11 NOTE — ED Notes (Signed)
Report given to Karen RN

## 2011-10-12 LAB — GLUCOSE, CAPILLARY
Glucose-Capillary: 130 mg/dL — ABNORMAL HIGH (ref 70–99)
Glucose-Capillary: 129 mg/dL — ABNORMAL HIGH (ref 70–99)

## 2011-10-12 MED ORDER — ALPRAZOLAM 1 MG PO TABS
1.0000 mg | ORAL_TABLET | Freq: Once | ORAL | Status: AC
  Administered 2011-10-12: 1 mg via ORAL
  Filled 2011-10-12: qty 1

## 2011-10-12 MED ORDER — ALPRAZOLAM 1 MG PO TABS
2.0000 mg | ORAL_TABLET | Freq: Three times a day (TID) | ORAL | Status: DC
  Administered 2011-10-12 – 2011-10-13 (×4): 2 mg via ORAL
  Filled 2011-10-12 (×4): qty 2

## 2011-10-12 MED ORDER — PREDNISONE 20 MG PO TABS
40.0000 mg | ORAL_TABLET | Freq: Every day | ORAL | Status: DC
  Administered 2011-10-13: 40 mg via ORAL
  Filled 2011-10-12: qty 2

## 2011-10-12 MED ORDER — LEVALBUTEROL HCL 0.63 MG/3ML IN NEBU
0.6300 mg | INHALATION_SOLUTION | Freq: Four times a day (QID) | RESPIRATORY_TRACT | Status: DC
  Administered 2011-10-12 – 2011-10-13 (×6): 0.63 mg via RESPIRATORY_TRACT
  Filled 2011-10-12 (×5): qty 3

## 2011-10-12 MED ORDER — POLYVINYL ALCOHOL 1.4 % OP SOLN: 1.0000 [drp] | Freq: Three times a day (TID) | OPHTHALMIC | Status: DC | PRN

## 2011-10-12 MED ORDER — INFLUENZA VIRUS VACC SPLIT PF IM SUSP
0.5000 mL | INTRAMUSCULAR | Status: DC
  Filled 2011-10-12: qty 0.5

## 2011-10-12 MED ORDER — SALINE SPRAY 0.65 % NA SOLN
1.0000 | NASAL | Status: DC | PRN
  Administered 2011-10-12: 1 via NASAL
  Filled 2011-10-12: qty 44

## 2011-10-12 MED ORDER — GLUCERNA SHAKE PO LIQD
237.0000 mL | Freq: Every day | ORAL | Status: DC
  Administered 2011-10-12: 237 mL via ORAL

## 2011-10-12 NOTE — Progress Notes (Signed)
Discussed with RN, who reports shaking spells which worsen upon staff entering the room.  Subjective: Complaining of tremors, anxiety. Reports taking Xanax 2 mg 4 times a day, though they are prescribed 3 times a day. Complains of year pain. Objective: Vital signs in last 24 hours: Filed Vitals:   10/11/11 2307 10/12/11 0359 10/12/11 0630 10/12/11 0709  BP:   113/67   Pulse:   96   Temp:   97.5 F (36.4 C)   TempSrc:      Resp:   20   Height:      Weight:   72.303 kg (159 lb 6.4 oz)   SpO2: 99% 99% 99% 99%   Weight change:   Intake/Output Summary (Last 24 hours) at 10/12/11 1123 Last data filed at 10/12/11 0815  Gross per 24 hour  Intake 1221.36 ml  Output    250 ml  Net 971.36 ml   Initially talking on the phone. When I entered the room, she quickly hung up, started shaking the bed and hyperventilating. Patient is oriented. Does appear very anxious. Lungs: Clear to auscultation bilaterally without wheeze rhonchi or rales Cardiovascular regular rate rhythm without murmurs gallops rubs Abdomen soft nontender nondistended Extremities no clubbing cyanosis or edema Psychiatric: Anxious affect.  Lab Results: Basic Metabolic Panel:  Lab 10/11/11 1610  NA 133*  K 4.2  CL 91*  CO2 28  GLUCOSE 107*  BUN 22  CREATININE 1.14*  CALCIUM 10.2  MG --  PHOS --   Liver Function Tests: No results found for this basename: AST:2,ALT:2,ALKPHOS:2,BILITOT:2,PROT:2,ALBUMIN:2 in the last 168 hours No results found for this basename: LIPASE:2,AMYLASE:2 in the last 168 hours No results found for this basename: AMMONIA:2 in the last 168 hours CBC:  Lab 10/11/11 1256  WBC 7.4  NEUTROABS 5.2  HGB 14.0  HCT 40.0  MCV 79.7  PLT 309   Cardiac Enzymes: No results found for this basename: CKTOTAL:3,CKMB:3,CKMBINDEX:3,TROPONINI:3 in the last 168 hours BNP: No results found for this basename: PROBNP:3 in the last 168 hours D-Dimer: No results found for this basename: DDIMER:2 in the  last 168 hours CBG:  Lab 10/12/11 0723 10/11/11 2205  GLUCAP 130* 157*   Micro Results: No results found for this or any previous visit (from the past 240 hour(s)). Studies/Results: Dg Chest Portable 1 View  10/11/2011  *RADIOLOGY REPORT*  Clinical Data: Cough, nasal congestion, shortness of breath  PORTABLE CHEST - 1 VIEW  Comparison: 08/13/2011  Findings: Heart size and vascular pattern are normal.  No infiltrate or effusion.  Mild scarring or atelectasis in the left lower lobe is stable.  Hyperinflation suggests an element of COPD.  IMPRESSION: No acute findings.   Original Report Authenticated By: Otilio Carpen, M.D.    Scheduled Meds:   . albuterol  2.5 mg Nebulization Once  . albuterol      . albuterol      . ALPRAZolam  1 mg Oral Once  . alprazolam  2 mg Oral TID  . aspirin EC  81 mg Oral Daily  . budesonide-formoterol  2 puff Inhalation BID  . enoxaparin (LOVENOX) injection  40 mg Subcutaneous Q24H  . feeding supplement  237 mL Oral Q2000  . fluticasone  2 spray Each Nare Daily  . HYDROmorphone  1 mg Intravenous Once  . insulin aspart  0-15 Units Subcutaneous TID WC  . insulin aspart  0-5 Units Subcutaneous QHS  . ipratropium      . ipratropium  0.5 mg Nebulization Once  .  ipratropium  0.5 mg Nebulization QID  . ketorolac  30 mg Intravenous Once  . levalbuterol  0.63 mg Nebulization QID  . levofloxacin  500 mg Oral Daily  . LORazepam  1 mg Intravenous Once  . methylPREDNISolone (SOLU-MEDROL) injection  125 mg Intravenous Once  . montelukast  10 mg Oral QHS  . nicotine  14 mg Transdermal Daily  . pantoprazole  40 mg Oral Daily  . predniSONE  40 mg Oral Q breakfast  . roflumilast  500 mcg Oral Daily  . DISCONTD: alprazolam  1 mg Oral TID  . DISCONTD: levalbuterol  0.63 mg Nebulization Q4H  . DISCONTD: methylPREDNISolone (SOLU-MEDROL) injection  60 mg Intravenous Q12H   Continuous Infusions:   . DISCONTD: sodium chloride 100 mL/hr at 10/12/11 0426  . DISCONTD:  albuterol 10 mg/hr (10/11/11 1347)   PRN Meds:.acetaminophen, acetaminophen, alum & mag hydroxide-simeth, benzonatate, HYDROmorphone (DILAUDID) injection, ondansetron (ZOFRAN) IV, ondansetron, oxyCODONE, polyvinyl alcohol, DISCONTD: dextran 70-hypromellose Assessment/Plan: Principal Problem:  *COPD exacerbation, improved. Will change to prednisone 40 mg a day. Decrease bronchodilators to 4 times a day. Active Problems:  Acute sinusitis  Acute bronchitis  Opiate dependence  Benzodiazepine dependence  DM type 2 (diabetes mellitus, type 2)  Benign hypertension Tremulousness: Likely multifactorial. Patient has been on Xanax here, but lower dose. I've discussed the case with patient's primary care provider, Prudy Feeler, PA. There is no noted history of prescription medication abuse, however patient does admit to taking more Xanax than as prescribed. Will change to 2 mg 3 times a day. Decreasing the Xopenex frequency will likely help as well. Increase activity. Hopefully home tomorrow if stable.   LOS: 1 day   Karleigh Bunte L 10/12/2011, 11:23 AM

## 2011-10-12 NOTE — Progress Notes (Signed)
10/12/11 1830 Patient seems less anxious this evening, slight tremors at times, after scheduled xanax 2 mg per orders. States still feels somewhat anxious and "haven't slept all day, need something to help me sleep". States usually takes something to help her sleep at home. Discussed with patient that next xanax dose scheduled for bedtime, may  Help rest. Still requests sleeping pill if possible. Notified Dr Lendell Caprice, no new orders needed at this time. Nursing to monitor. Earnstine Regal, RN.
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
## 2011-10-12 NOTE — Progress Notes (Signed)
INITIAL ADULT NUTRITION ASSESSMENT Date: 10/12/2011   Time: 10:41 AM Reason for Assessment: Malnutrition Screen  ASSESSMENT: Female 61 y.o.  Dx: COPD exacerbation   Past Medical History  Diagnosis Date  . COPD (chronic obstructive pulmonary disease)   . Asthma   . Diabetes mellitus   . MI (myocardial infarction)   . Hypertension   . Seizures   . Manic depressive disorder   . Chronic pain     Scheduled Meds:   . albuterol  2.5 mg Nebulization Once  . albuterol      . albuterol      . alprazolam  1 mg Oral TID  . aspirin EC  81 mg Oral Daily  . budesonide-formoterol  2 puff Inhalation BID  . enoxaparin (LOVENOX) injection  40 mg Subcutaneous Q24H  . fluticasone  2 spray Each Nare Daily  . HYDROmorphone  1 mg Intravenous Once  . insulin aspart  0-15 Units Subcutaneous TID WC  . insulin aspart  0-5 Units Subcutaneous QHS  . ipratropium      . ipratropium  0.5 mg Nebulization Once  . ipratropium  0.5 mg Nebulization QID  . ketorolac  30 mg Intravenous Once  . levalbuterol  0.63 mg Nebulization Q4H  . levofloxacin  500 mg Oral Daily  . LORazepam  1 mg Intravenous Once  . methylPREDNISolone (SOLU-MEDROL) injection  125 mg Intravenous Once  . methylPREDNISolone (SOLU-MEDROL) injection  60 mg Intravenous Q12H  . montelukast  10 mg Oral QHS  . nicotine  14 mg Transdermal Daily  . pantoprazole  40 mg Oral Daily  . roflumilast  500 mcg Oral Daily   Continuous Infusions:   . sodium chloride 100 mL/hr at 10/12/11 0426  . DISCONTD: albuterol 10 mg/hr (10/11/11 1347)   PRN Meds:.acetaminophen, acetaminophen, alum & mag hydroxide-simeth, benzonatate, HYDROmorphone (DILAUDID) injection, ondansetron (ZOFRAN) IV, ondansetron, oxyCODONE, polyvinyl alcohol, DISCONTD: dextran 70-hypromellose  Ht: 5\' 6"  (167.6 cm)  Wt: 159 lb 6.4 oz (72.303 kg)  Ideal Wt: 59.3 kg  % Ideal Wt: 122%  Usual Wt:  Wt Readings from Last 10 Encounters:  10/12/11 159 lb 6.4 oz (72.303 kg)  08/13/11  145 lb (65.772 kg)  08/04/11 149 lb (67.586 kg)  01/23/11 140 lb (63.504 kg)     Body mass index is 25.73 kg/(m^2). Overweight  Food/Nutrition Related Hx: Pt reports hx of wt loss however, unable to quantify. Based on hospital wt records she has gained ~14#,10% in past 60 days. She is on daily wt monitoring and I/O q shift.     CMP     Component Value Date/Time   NA 133* 10/11/2011 1256   K 4.2 10/11/2011 1256   CL 91* 10/11/2011 1256   CO2 28 10/11/2011 1256   GLUCOSE 107* 10/11/2011 1256   BUN 22 10/11/2011 1256   CREATININE 1.14* 10/11/2011 1256   CALCIUM 10.2 10/11/2011 1256   GFRNONAA 50* 10/11/2011 1256   GFRAA 58* 10/11/2011 1256   CBG (last 3)   Basename 10/12/11 0723 10/11/11 2205  GLUCAP 130* 157*      Intake/Output Summary (Last 24 hours) at 10/12/11 1046 Last data filed at 10/12/11 0815  Gross per 24 hour  Intake 1221.36 ml  Output    250 ml  Net 971.36 ml    Diet Order: Carb Control po 75% breakfast  Supplements:none at this time  IVF:    sodium chloride Last Rate: 100 mL/hr at 10/12/11 0426  DISCONTD: albuterol Last Rate: 10 mg/hr (10/11/11 1347)  Estimated Nutritional Needs:   Kcal:1500-1800 kcal Protein:65-75 gr Fluid:1 ml/kcal  NUTRITION DIAGNOSIS: None at this time  MONITORING/EVALUATION(Goals): Monitor po intake meals and supplement, wt changes  EDUCATION NEEDS: -No education needs identified at this time  INTERVENTION: -Add Glucerna Shake po q HS; each supplement provides 220 kcal and 10 grams of protein.  Dietitian [phone]  DOCUMENTATION CODES Per approved criteria  -Not Applicable    Monica Stuart 10/12/2011, 10:41 AM

## 2011-10-12 NOTE — Progress Notes (Signed)
UR Chart Review Completed  

## 2011-10-12 NOTE — Plan of Care (Signed)
Problem: Phase I Progression Outcomes Goal: Pain controlled with appropriate interventions Outcome: Progressing 10/12/11 1622 Patient has chronic back pain, states decreased with pain medication as ordered.

## 2011-10-12 NOTE — Progress Notes (Signed)
10/12/11 0831 Patient very anxious and shaking uncontrollably this morning on assessment, requested pain medication for chronic back pain, and "something to stop the tremors". Notified Dr Lendell Caprice, stated okay to give scheduled xanax dose for 1000 early. Given as ordered. Emotional support given, patient requested her sister, Gershon Cull be called to come and sit with her, spoke with Gershon Cull, stated was on the way. Sister at bedside, patient shaking has calmed some, appears little less anxious with sister arrival. Nursing to monitor. Earnstine Regal, RN

## 2011-10-13 DIAGNOSIS — J441 Chronic obstructive pulmonary disease with (acute) exacerbation: Secondary | ICD-10-CM

## 2011-10-13 DIAGNOSIS — F1320 Sedative, hypnotic or anxiolytic dependence, uncomplicated: Secondary | ICD-10-CM

## 2011-10-13 DIAGNOSIS — J209 Acute bronchitis, unspecified: Secondary | ICD-10-CM

## 2011-10-13 DIAGNOSIS — J019 Acute sinusitis, unspecified: Secondary | ICD-10-CM

## 2011-10-13 MED ORDER — POLYETHYLENE GLYCOL 3350 17 G PO PACK
17.0000 g | PACK | Freq: Every day | ORAL | Status: DC
  Administered 2011-10-13: 17 g via ORAL
  Filled 2011-10-13: qty 1

## 2011-10-13 MED ORDER — OXYCODONE HCL 5 MG PO TABS: 5.0000 mg | ORAL_TABLET | ORAL | Status: DC | PRN

## 2011-10-13 MED ORDER — ALPRAZOLAM 2 MG PO TABS: 2.0000 mg | ORAL_TABLET | Freq: Two times a day (BID) | ORAL | Status: DC | PRN

## 2011-10-13 MED ORDER — SODIUM CHLORIDE 0.9 % IJ SOLN
INTRAMUSCULAR | Status: AC
  Administered 2011-10-13: 3 mL
  Filled 2011-10-13: qty 3

## 2011-10-13 MED ORDER — FLUTICASONE PROPIONATE 50 MCG/ACT NA SUSP: 2.0000 | Freq: Every day | NASAL | Status: AC

## 2011-10-13 MED ORDER — ACETAMINOPHEN 325 MG PO TABS: 650.0000 mg | ORAL_TABLET | Freq: Four times a day (QID) | ORAL | Status: AC | PRN

## 2011-10-13 MED ORDER — BISACODYL 10 MG RE SUPP
10.0000 mg | Freq: Every day | RECTAL | Status: DC | PRN
  Administered 2011-10-13: 10 mg via RECTAL
  Filled 2011-10-13: qty 1

## 2011-10-13 MED ORDER — BIOTENE DRY MOUTH MT LIQD
15.0000 mL | Freq: Two times a day (BID) | OROMUCOSAL | Status: DC
  Administered 2011-10-13: 15 mL via OROMUCOSAL

## 2011-10-13 MED ORDER — POLYETHYLENE GLYCOL 3350 17 G PO PACK: 17.0000 g | PACK | Freq: Every day | ORAL | Status: AC | PRN
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
## 2011-10-13 NOTE — Progress Notes (Signed)
10/13/11 1557 Instructed patient to notify nursing staff of sister arrival for discharge this afternoon, stated would do so. Patient arrived at nurses station in w/c accompanied by sister and nurse tech, stating ready to go. Reviewed discharge instructions with patient and her sister, Monica Stuart, at patient's request. Given copy of instructions, med list, prescriptions, and f/u information. Patient and sister verbalized understanding, pt stated preferred to call and schedule f/u appointment herself. Concerned about whether medicare/medicaid would cover prescriptions, discussed with case manager, stated pt should contact pharmacy to see when prescriptions were last filled, pt stated okay. Pt left floor in stable condition via w/c accompanied by nurse tech. Earnstine Regal, RN

## 2011-10-13 NOTE — Progress Notes (Signed)
10/13/11 1236 Patient continued to c/o chronic back pain, requested dilaudid as ordered PRN for pain. Discussed with patient that oxycodone not yet due, and may need to avoid IV pain medication since being discharged home today, transitioned to po pain medication. Patient insisted on dilaudid for pain, discussed with Dr Lendell Caprice, stated okay to given dilaudid IV as ordered now, but no oxycodone prior to discharge if taking dilaudid. Discussed with patient, stated understood. Pt awaiting sister arrival for discharge, preferred to have discharge instructions reviewed with her when sister arrives.  States has had two stools since receiving dulcolax suppository as ordered this morning. Earnstine Regal, RN

## 2011-10-13 NOTE — Plan of Care (Signed)
Problem: Discharge Progression Outcomes Goal: Flu vaccine received if indicated Outcome: Not Applicable Date Met:  10/13/11 Patient preferred to have flu vaccine from primary physician office since has been feeling bad on this admission.  Goal: Pneumonia vaccine received if indicated Outcome: Not Applicable Date Met:  10/13/11 Patient stated has received pneumonia vaccine within the last 5 years.

## 2012-05-31 ENCOUNTER — Other Ambulatory Visit: Payer: Self-pay

## 2012-05-31 DIAGNOSIS — G4710 Hypersomnia, unspecified: Secondary | ICD-10-CM

## 2012-07-20 ENCOUNTER — Other Ambulatory Visit: Payer: Self-pay | Admitting: Neurology

## 2012-07-24 ENCOUNTER — Other Ambulatory Visit (HOSPITAL_COMMUNITY): Payer: Medicare Other

## 2012-07-27 ENCOUNTER — Ambulatory Visit: Payer: Medicare Other | Attending: Neurology | Admitting: Sleep Medicine

## 2012-07-27 DIAGNOSIS — G4710 Hypersomnia, unspecified: Secondary | ICD-10-CM

## 2012-07-27 DIAGNOSIS — R0609 Other forms of dyspnea: Secondary | ICD-10-CM | POA: Insufficient documentation

## 2012-07-27 DIAGNOSIS — R5383 Other fatigue: Secondary | ICD-10-CM | POA: Insufficient documentation

## 2012-07-27 DIAGNOSIS — R0989 Other specified symptoms and signs involving the circulatory and respiratory systems: Secondary | ICD-10-CM | POA: Insufficient documentation

## 2012-07-27 DIAGNOSIS — R5381 Other malaise: Secondary | ICD-10-CM | POA: Insufficient documentation

## 2012-07-31 ENCOUNTER — Ambulatory Visit (HOSPITAL_COMMUNITY)
Admission: RE | Admit: 2012-07-31 | Discharge: 2012-07-31 | Disposition: A | Discharge status: Home / Self Care | Payer: Medicare Other | Source: Ambulatory Visit | Attending: Neurology | Admitting: Neurology

## 2012-07-31 ENCOUNTER — Encounter (HOSPITAL_COMMUNITY): Payer: Self-pay

## 2012-07-31 DIAGNOSIS — F29 Unspecified psychosis not due to a substance or known physiological condition: Secondary | ICD-10-CM | POA: Insufficient documentation

## 2012-07-31 DIAGNOSIS — R209 Unspecified disturbances of skin sensation: Secondary | ICD-10-CM | POA: Insufficient documentation

## 2012-07-31 DIAGNOSIS — R51 Headache: Secondary | ICD-10-CM | POA: Insufficient documentation

## 2012-08-04 NOTE — Procedures (Signed)
[facility] Marinus Eicher A. Gerilyn Pilgrim, MD     [email or web address]        NAME:  Monica Stuart, Monica Stuart                 ACCOUNT NO.:  [id number]  MEDICAL RECORD NO.:  [id number]          PATIENT TYPE:  OUT  LOCATION:  SLEEP LAB                     FACILITY:  [facility]  PHYSICIAN:  Abhiraj Dozal A. Gerilyn Pilgrim, M.D. DATE OF BIRTH:  [birth year]  DATE OF STUDY:  07/31/2012                           NOCTURNAL POLYSOMNOGRAM  REFERRING PHYSICIAN:  Kealani Leckey A. Gerilyn Pilgrim, M.D.  INDICATIONS:  A 62 year old lady who presents with snoring and fatigue. The study is being done to evaluate for obstructive sleep apnea syndrome.  MEDICATIONS:  Prilosec, MiraLax, Singulair, diazepam, alprazolam, chlorthalidone, Lasix, Cozaar, aspirin, lisinopril, Colace, Norvasc, albuterol, metformin, multivitamin, risperidone, Spiriva, and Trileptal.  EPWORTH SLEEPINESS SCALE:  6.  BMI 28.  ARCHITECTURAL SUMMARY:  This is a full night titration recording study. The total recording time is 393 minutes.  Sleep efficiency is 90%, sleep latency 25 minutes.  REM latency 137 minutes.  Stage N1 3.5%, N2 38%, N3 is calculated at 35%, but interviewing this study, this is excessive and the N3 is more in order of 10% to 15% and it appeared allow this which has been scored as N2 sleep.  The stage REM sleep is 23%.  RESPIRATORY SUMMARY:  Baseline oxygen saturation is 98, the study was done on 2 liters nocturnal oxygen, lowest saturation 95%.  AHI is 0.2 and the RDI is also 0.2.  LIMB MOVEMENT SUMMARY:  PLM index 0  ELECTROCARDIOGRAM SUMMARY:  Average heart rate is 70 with sinus arrhythmia noted, otherwise unremarkable.  IMPRESSION:  Unremarkable nocturnal polysomnography.   Ariyan Brisendine A. Gerilyn Pilgrim, M.D.    KAD/MEDQ  D:  08/04/2012 13:04:19  T:  08/04/2012 13:17:22  Job:  [id number]

## 2013-03-14 ENCOUNTER — Other Ambulatory Visit: Payer: Self-pay | Admitting: Neurology

## 2013-03-14 ENCOUNTER — Other Ambulatory Visit (HOSPITAL_COMMUNITY): Payer: Self-pay | Admitting: Nurse Practitioner

## 2013-03-14 DIAGNOSIS — R9089 Other abnormal findings on diagnostic imaging of central nervous system: Secondary | ICD-10-CM

## 2013-03-28 ENCOUNTER — Ambulatory Visit (HOSPITAL_COMMUNITY): Payer: Medicare Other

## 2013-04-03 ENCOUNTER — Ambulatory Visit (HOSPITAL_COMMUNITY)
Admission: RE | Admit: 2013-04-03 | Discharge: 2013-04-03 | Disposition: A | Discharge status: Home / Self Care | Payer: Medicare Other | Source: Ambulatory Visit | Attending: Nurse Practitioner | Admitting: Nurse Practitioner

## 2013-04-03 DIAGNOSIS — E119 Type 2 diabetes mellitus without complications: Secondary | ICD-10-CM | POA: Insufficient documentation

## 2013-04-03 DIAGNOSIS — I10 Essential (primary) hypertension: Secondary | ICD-10-CM | POA: Insufficient documentation

## 2013-04-03 DIAGNOSIS — R9089 Other abnormal findings on diagnostic imaging of central nervous system: Secondary | ICD-10-CM

## 2013-04-03 DIAGNOSIS — R51 Headache: Secondary | ICD-10-CM | POA: Insufficient documentation

## 2013-11-11 ENCOUNTER — Encounter (HOSPITAL_COMMUNITY): Payer: Self-pay

## 2015-03-10 ENCOUNTER — Emergency Department (HOSPITAL_COMMUNITY): Payer: Medicare HMO

## 2015-03-10 ENCOUNTER — Encounter (HOSPITAL_COMMUNITY): Payer: Self-pay

## 2015-03-10 ENCOUNTER — Emergency Department (HOSPITAL_COMMUNITY)
Admission: EM | Admit: 2015-03-10 | Discharge: 2015-03-10 | Disposition: A | Discharge status: Home / Self Care | Payer: Medicare HMO | Attending: Emergency Medicine | Admitting: Emergency Medicine

## 2015-03-10 DIAGNOSIS — E119 Type 2 diabetes mellitus without complications: Secondary | ICD-10-CM | POA: Insufficient documentation

## 2015-03-10 DIAGNOSIS — Z7951 Long term (current) use of inhaled steroids: Secondary | ICD-10-CM | POA: Diagnosis not present

## 2015-03-10 DIAGNOSIS — I252 Old myocardial infarction: Secondary | ICD-10-CM | POA: Diagnosis not present

## 2015-03-10 DIAGNOSIS — F419 Anxiety disorder, unspecified: Secondary | ICD-10-CM | POA: Diagnosis not present

## 2015-03-10 DIAGNOSIS — F17210 Nicotine dependence, cigarettes, uncomplicated: Secondary | ICD-10-CM | POA: Diagnosis not present

## 2015-03-10 DIAGNOSIS — G8929 Other chronic pain: Secondary | ICD-10-CM | POA: Diagnosis not present

## 2015-03-10 DIAGNOSIS — Z7982 Long term (current) use of aspirin: Secondary | ICD-10-CM | POA: Insufficient documentation

## 2015-03-10 DIAGNOSIS — J449 Chronic obstructive pulmonary disease, unspecified: Secondary | ICD-10-CM | POA: Insufficient documentation

## 2015-03-10 DIAGNOSIS — M7918 Myalgia, other site: Secondary | ICD-10-CM

## 2015-03-10 DIAGNOSIS — I10 Essential (primary) hypertension: Secondary | ICD-10-CM | POA: Insufficient documentation

## 2015-03-10 DIAGNOSIS — Z79899 Other long term (current) drug therapy: Secondary | ICD-10-CM | POA: Insufficient documentation

## 2015-03-10 DIAGNOSIS — F329 Major depressive disorder, single episode, unspecified: Secondary | ICD-10-CM | POA: Insufficient documentation

## 2015-03-10 DIAGNOSIS — M25512 Pain in left shoulder: Secondary | ICD-10-CM | POA: Insufficient documentation

## 2015-03-10 DIAGNOSIS — M546 Pain in thoracic spine: Secondary | ICD-10-CM | POA: Diagnosis not present

## 2015-03-10 HISTORY — DX: Anxiety disorder, unspecified: F41.9

## 2015-03-10 LAB — CBC WITH DIFFERENTIAL/PLATELET
BASOS PCT: 1 %
Basophils Absolute: 0.1 10*3/uL (ref 0.0–0.1)
Eosinophils Absolute: 0.1 10*3/uL (ref 0.0–0.7)
Eosinophils Relative: 1 %
HCT: 40.8 % (ref 36.0–46.0)
Hemoglobin: 13.3 g/dL (ref 12.0–15.0)
Lymphocytes Relative: 46 %
Lymphs Abs: 3.5 10*3/uL (ref 0.7–4.0)
MCH: 28 pg (ref 26.0–34.0)
MCHC: 32.6 g/dL (ref 30.0–36.0)
MCV: 85.9 fL (ref 78.0–100.0)
Monocytes Absolute: 0.5 10*3/uL (ref 0.1–1.0)
Monocytes Relative: 7 %
NEUTROS ABS: 3.4 10*3/uL (ref 1.7–7.7)
NEUTROS PCT: 45 %
PLATELETS: 284 10*3/uL (ref 150–400)
RBC: 4.75 MIL/uL (ref 3.87–5.11)
RDW: 14 % (ref 11.5–15.5)
WBC: 7.7 10*3/uL (ref 4.0–10.5)

## 2015-03-10 LAB — BASIC METABOLIC PANEL
ANION GAP: 6 (ref 5–15)
BUN: 16 mg/dL (ref 6–20)
CALCIUM: 8.9 mg/dL (ref 8.9–10.3)
CO2: 31 mmol/L (ref 22–32)
Chloride: 105 mmol/L (ref 101–111)
Creatinine, Ser: 1.26 mg/dL — ABNORMAL HIGH (ref 0.44–1.00)
GFR, EST AFRICAN AMERICAN: 51 mL/min — AB (ref >60)
GFR, EST NON AFRICAN AMERICAN: 44 mL/min — AB (ref >60)
Glucose, Bld: 105 mg/dL — ABNORMAL HIGH (ref 65–99)
Potassium: 4.2 mmol/L (ref 3.5–5.1)
SODIUM: 142 mmol/L (ref 135–145)

## 2015-03-10 LAB — TROPONIN I

## 2015-03-10 MED ORDER — METHOCARBAMOL 500 MG PO TABS: 500.0000 mg | ORAL_TABLET | Freq: Two times a day (BID) | ORAL | Status: AC | PRN

## 2015-03-10 MED ORDER — MORPHINE SULFATE (PF) 4 MG/ML IV SOLN
4.0000 mg | Freq: Once | INTRAVENOUS | Status: AC
  Administered 2015-03-10: 4 mg via INTRAMUSCULAR
  Filled 2015-03-10: qty 1

## 2015-03-10 NOTE — ED Notes (Signed)
Patient reports of left shoulder pain that radiates into left chest wall since Saturday. Denies recent injury.

## 2015-03-10 NOTE — ED Provider Notes (Signed)
CSN: [id number]     Arrival date & time 03/10/15  1248 History   First MD Initiated Contact with Patient 03/10/15 1446     Chief Complaint  Patient presents with  . Shoulder Pain     HPI Pt was seen at 1455. Per pt, c/o gradual onset and persistence of constant left shoulder "pain" for the past 3 days. Pain worsens with palpation of the area and movement of her left arm. Describes the pain as "aching" and "throbbing." Pt states she was told previously by Ortho MD that "my shoulder is bone on bone and they can't do much about it." Denies injury, no fevers, no CP/palpitations, no SOB/cough, no abd pain, no N/V/D, no rash, no fevers.    Past Medical History  Diagnosis Date  . COPD (chronic obstructive pulmonary disease) (HCC)   . Asthma   . Diabetes mellitus   . MI (myocardial infarction) (HCC)   . Hypertension   . Seizures (HCC)   . Manic depressive disorder (HCC)   . Chronic pain   . Anxiety    Past Surgical History  Procedure Laterality Date  . Rotator cuff repair    . Breast cyst excision    . Cystectomy      left leg  . Tubal ligation    . Abdominal hysterectomy     Family History  Problem Relation Age of Onset  . Cancer Mother   . Stroke Father    Social History  Substance Use Topics  . Smoking status: Current Every Day Smoker -- 0.50 packs/day for 40 years    Types: Cigarettes  . Smokeless tobacco: Never Used  . Alcohol Use: No   OB History    Gravida Para Term Preterm AB TAB SAB Ectopic Multiple Living   Review of Systems ROS: Statement: All systems negative except as marked or noted in the HPI; Constitutional: Negative for fever and chills. ; ; Eyes: Negative for eye pain, redness and discharge. ; ; ENMT: Negative for ear pain, hoarseness, nasal congestion, sinus pressure and sore throat. ; ; Cardiovascular: Negative for chest pain, palpitations, diaphoresis, dyspnea and peripheral edema. ; ; Respiratory: Negative for cough, wheezing and  stridor. ; ; Gastrointestinal: Negative for nausea, vomiting, diarrhea, abdominal pain, blood in stool, hematemesis, jaundice and rectal bleeding. . ; ; Genitourinary: Negative for dysuria, flank pain and hematuria. ; ; Musculoskeletal: +left shoulder pain. Negative for neck pain. Negative for swelling and trauma.; ; Skin: Negative for pruritus, rash, abrasions, blisters, bruising and skin lesion.; ; Neuro: Negative for headache, lightheadedness and neck stiffness. Negative for weakness, altered level of consciousness , altered mental status, extremity weakness, paresthesias, involuntary movement, seizure and syncope.      Allergies  Amitriptyline; Guaifenesin er; and Levaquin  Home Medications   Prior to Admission medications   Medication Sig Start Date End Date Taking? Authorizing Provider  albuterol (PROVENTIL HFA;VENTOLIN HFA) 108 (90 BASE) MCG/ACT inhaler Inhale 1-2 puffs into the lungs every 6 (six) hours as needed for wheezing. 08/13/11 03/10/15 Yes Eber Hong, MD  ALPRAZolam Prudy Feeler) 1 MG tablet Take 1 mg by mouth 3 (three) times daily.   Yes Historical Provider, MD  aspirin EC 81 MG tablet Take 81 mg by mouth daily.   Yes Historical Provider, MD  budesonide-formoterol (SYMBICORT) 160-4.5 MCG/ACT inhaler Inhale 2 puffs into the lungs 2 (two) times daily.   Yes Historical Provider, MD  fluticasone Aleda Grana)  50 MCG/ACT nasal spray Place 2 sprays into the nose daily. 10/13/11  Yes Christiane Ha, MD  furosemide (LASIX) 20 MG tablet Take 20 mg by mouth daily.   Yes Historical Provider, MD  HYDROcodone-acetaminophen (NORCO) 10-325 MG tablet Take 1 tablet by mouth 2 (two) times daily.   Yes Historical Provider, MD  losartan-hydrochlorothiazide (HYZAAR) 100-25 MG per tablet Take 1 tablet by mouth daily.   Yes Historical Provider, MD  montelukast (SINGULAIR) 10 MG tablet Take 10 mg by mouth at bedtime.   Yes Historical Provider, MD  Multiple Vitamins-Minerals (CENTRUM PO) Take 1 tablet by mouth  daily.   Yes Historical Provider, MD  pantoprazole (PROTONIX) 40 MG tablet Take 40 mg by mouth daily.   Yes Historical Provider, MD  polyethylene glycol (MIRALAX / GLYCOLAX) packet Take 17 g by mouth daily as needed. 10/13/11  Yes Christiane Ha, MD  predniSONE (DELTASONE) 10 MG tablet Take 10 mg by mouth daily. Take 6 tablets by mouth for 4 days, then take 4 tablets for 4 days, then take 2 tablets for 4 days.   Yes Historical Provider, MD  roflumilast (DALIRESP) 500 MCG TABS tablet Take 500 mcg by mouth daily.   Yes Historical Provider, MD  acetaminophen (TYLENOL) 325 MG tablet Take 2 tablets (650 mg total) by mouth every 6 (six) hours as needed (or Fever >/= 101). Patient not taking: Reported on 03/10/2015 10/13/11   Christiane Ha, MD   BP 127/85 mmHg  Pulse 87  Temp(Src) 97.9 F (36.6 C) (Oral)  Resp 18  Ht  (1.702 m)  Wt 160 lb (72.576 kg)  BMI 25.05 kg/m2  SpO2 95% Physical Exam  1500: Physical examination:  Nursing notes reviewed; Vital signs and O2 SAT reviewed;  Constitutional: Well developed, Well nourished, Well hydrated, In no acute distress; Head:  Normocephalic, atraumatic; Eyes: EOMI, PERRL, No scleral icterus; ENMT: Mouth and pharynx normal, Mucous membranes moist; Neck: Supple, Full range of motion, No lymphadenopathy; Cardiovascular: Regular rate and rhythm, No gallop; Respiratory: Breath sounds clear & equal bilaterally, No wheezes.  Speaking full sentences with ease, Normal respiratory effort/excursion; Chest: Nontender, Movement normal; Abdomen: Soft, Nontender, Nondistended, Normal bowel sounds; Genitourinary: No CVA tenderness; Spine:  No midline CS, TS, LS tenderness. +TTP left hypertonic trapezius and upper thoracic paraspinal muscles. No rash.;; Extremities: Pulses normal, No tenderness, No edema, No calf edema or asymmetry.  Left shoulder w/FROM.  NT to palp entire joint, AC joint, clavicle NT, scapula NT, proximal humerus NT, biceps tendon NT over bicipital  groove.  Motor strength at shoulder normal.  Sensation intact over deltoid region, distal NMS intact with left hand having intact and equal sensation and strength in the distribution of the median, radial, and ulnar nerve function compared to opposite side.  Strong radial pulse.  +FROM left elbow with intact motor strength biceps and triceps muscles to resistance. ; Neuro: AA&Ox3, Major CN grossly intact.  Speech clear. No gross focal motor or sensory deficits in extremities.; Skin: Color normal, Warm, Dry.   ED Course  Procedures (including critical care time) Labs Review  Imaging Review  I have personally reviewed and evaluated these images and lab results as part of my medical decision-making.   EKG Interpretation   Date/Time:  Tuesday March 10 2015 12:56:48 EST Ventricular Rate:  90 PR Interval:  130 QRS Duration: 76 QT Interval:  358 QTC Calculation: 437 R Axis:   83 Text Interpretation:  Normal sinus rhythm Normal ECG When compared with  ECG of 10/11/2011 No significant change was found Confirmed by [facility]  MD,  Nicholos Johns [phone]) on 03/10/2015 3:29:39 PM      MDM  MDM Reviewed: previous chart, nursing note and vitals Reviewed previous: labs and ECG Interpretation: labs, ECG and x-ray      Results for orders placed or performed during the hospital encounter of 03/10/15  Troponin I  Result Value Ref Range   Troponin I <0.03 <0.031 ng/mL  Basic metabolic panel  Result Value Ref Range   Sodium 142 135 - 145 mmol/L   Potassium 4.2 3.5 - 5.1 mmol/L   Chloride 105 101 - 111 mmol/L   CO2 31 22 - 32 mmol/L   Glucose, Bld 105 (H) 65 - 99 mg/dL   BUN 16 6 - 20 mg/dL   Creatinine, Ser 6.04 (H) 0.44 - 1.00 mg/dL   Calcium 8.9 8.9 - 54.0 mg/dL   GFR calc non Af Amer 44 (L) >60 mL/min   GFR calc Af Amer 51 (L) >60 mL/min   Anion gap 6 5 - 15  CBC with Differential  Result Value Ref Range   WBC 7.7 4.0 - 10.5 K/uL   RBC 4.75 3.87 - 5.11 MIL/uL   Hemoglobin 13.3 12.0 -  15.0 g/dL   HCT 98.1 19.1 - 47.8 %   MCV 85.9 78.0 - 100.0 fL   MCH 28.0 26.0 - 34.0 pg   MCHC 32.6 30.0 - 36.0 g/dL   RDW 29.5 62.1 - 30.8 %   Platelets 284 150 - 400 K/uL   Neutrophils Relative % 45 %   Neutro Abs 3.4 1.7 - 7.7 K/uL   Lymphocytes Relative 46 %   Lymphs Abs 3.5 0.7 - 4.0 K/uL   Monocytes Relative 7 %   Monocytes Absolute 0.5 0.1 - 1.0 K/uL   Eosinophils Relative 1 %   Eosinophils Absolute 0.1 0.0 - 0.7 K/uL   Basophils Relative 1 %   Basophils Absolute 0.1 0.0 - 0.1 K/uL   Dg Chest 2 View 03/10/2015  CLINICAL DATA:  Left shoulder pain EXAM: CHEST  2 VIEW COMPARISON:  08/13/2011 FINDINGS: The heart size and mediastinal contours are within normal limits. Both lungs are clear. The visualized skeletal structures are unremarkable. IMPRESSION: No active cardiopulmonary disease. Electronically Signed   By: Elige Ko   On: 03/10/2015 16:08   Dg Shoulder Left 03/10/2015  CLINICAL DATA:  Acute left shoulder pain, left chest pain EXAM: LEFT SHOULDER - 2+ VIEW COMPARISON:  08/04/2011 FINDINGS: Bones are osteopenic. Degenerative changes of the [facility] joint and glenohumeral joint. No acute displaced fracture, dislocation or separation. Left upper lobe remains clear. Bones are osteopenic. Aortic atherosclerosis noted. IMPRESSION: Degenerative changes and osteopenia. No acute finding by plain radiography. Electronically Signed   By: Judie Petit.  Shick M.D.   On: 03/10/2015 16:07    1735:  Pt states she feels better after pain meds and wants to go home now. Doubt PE as cause for symptoms with low risk [place].  Doubt ACS as cause for symptoms with normal troponin and unchanged EKG from previous after 3 days of constant atypical symptoms. Workup reassuring. Tx symptomatically at this time. Pt already has narcotic pain meds at home. Dx and testing d/w pt and family.  Questions answered.  Verb understanding, agreeable to d/c home with outpt f/u.     Samuel Jester, DO 03/13/15 0005

## 2015-03-10 NOTE — Discharge Instructions (Signed)
°Emergency Department Resource Guide °1) Find a Doctor and Pay Out of Pocket °Although you won't have to find out who is covered by your insurance plan, it is a good idea to ask around and get recommendations. You will then need to call the office and see if the doctor you have chosen will accept you as a new patient and what types of options they offer for patients who are self-pay. Some doctors offer discounts or will set up payment plans for their patients who do not have insurance, but you will need to ask so you aren't surprised when you get to your appointment. ° °2) Contact Your Local Health Department °Not all health departments have doctors that can see patients for sick visits, but many do, so it is worth a call to see if yours does. If you don't know where your local health department is, you can check in your phone book. The CDC also has a tool to help you locate your state's health department, and many state websites also have listings of all of their local health departments. ° °3) Find a Walk-in Clinic °If your illness is not likely to be very severe or complicated, you may want to try a walk in clinic. These are popping up all over the country in pharmacies, drugstores, and shopping centers. They're usually staffed by nurse practitioners or physician assistants that have been trained to treat common illnesses and complaints. They're usually fairly quick and inexpensive. However, if you have serious medical issues or chronic medical problems, these are probably not your best option. ° °No Primary Care Doctor: °- Call [facility] at  832-8000 - they can help you locate a primary care doctor that  accepts your insurance, provides certain services, etc. °- Physician Referral Service- 1-800-533-3463 ° °Chronic Pain Problems: °Organization         Address  Phone   Notes  °[facility]  ([phone]) [phone] Patients need to be referred by their primary care doctor.  ° °Medication  Assistance: °Organization         Address  Phone   Notes  °[place] [facility] [address] °[place], [place] [address] (336) 641-8030 --Must be a resident of [place] °-- Must have NO insurance coverage whatsoever (no Medicaid/ Medicare, etc.) °-- The pt. MUST have a primary care doctor that directs their care regularly and follows them in the community °  °[facility]  (866) 331-1348   °[employer]  (888) 892-1162   ° °Agencies that provide inexpensive medical care: °Organization         Address  Phone   Notes  °[facility]  (336) 832-8035   °[facility]    (336) 832-7272   °[facility] [address] °[place], [place] [address] (336) 832-4777   °[facility] [address], °[place] (336) 271-4999   °[facility]    (336) 373-0678   °[facility]    (336) 272-1050   °[facility] ° [address], [place] Phone:  (336) 832-4444, Fax:  (336) 832-4440 Hours of Operation:  9 am - 6 pm, M-F.  Also accepts Medicaid/Medicare and self-pay.  °[facility] ° [address], [place] Phone: (336) [phone], Fax: (336) [phone]. Hours of Operation:  8:30 am - 5:30 pm, M-F.  Also accepts Medicaid and self-pay.  °[facility] [place] [address], [place] Phone: (336) 878-6027   °[facility] [address], [place], [place] (336)723-1848, Ext. 123 Mondays & Thursdays: 7-9 AM.  First 15 patients are seen on a first come, first serve basis. °  ° °Medicaid-accepting [place] Providers: ° °Organization         Address  Phone   Notes  °[facility] [address], [place] (336) 641-2100 Also accepts self-pay patients.  °[facility] [address], [place] ° (336) 856-9996   °[facility] [address], [place]  (336) 288-8857   °[facility] [address] [place] [address], [place] (336) 299-7000   °Veita Bland [address], [place]  ° (336) 373-1557 Only accepts [place] Access Medicaid patients after they have their name applied to their card.  ° °Self-Pay (no insurance) in [place]: ° °Organization         Address  Phone   Notes  °Sickle Cell Patients, [facility] [address], [place] (336) 832-1970   °[facility] [address], [place] (336) 832-4400   °[facility] ° [address], [place] (336) 992-4800   °[facility]/Dr. Osei-Bonsu ° [address] [place] [address], [place] or [address], [place] (336) [phone] Phone number for both [place] and [place] locations is the same.  °[facility] [address], [place] (336) 299-0000   °[facility] [address] [place] [address], [place] or [address] (336) 852-7530 °(336) 878-2260   °[facility] [address], [place] (336) 350-1642, phone; (336) 294-5005, fax Sees patients 1st and 3rd Saturday of every month.  Must not qualify for public or private insurance (i.e. Medicaid, Medicare, [place] Health Choice, Veterans' Benefits) • Household income should be no more than 200% of the poverty level •The clinic cannot treat you if you are pregnant or think you are pregnant • Sexually transmitted diseases are not treated at the clinic.  ° ° °Dental Care: °Organization         Address  Phone  Notes  °[place] [facility] [address], [place] (336) 641-6152 Accepts children up to age 21 who are enrolled in Medicaid or [place] Health Choice; pregnant women with a Medicaid card; and children who have applied for Medicaid or [place] Health Choice, but were declined, whose parents can pay a reduced fee at time of service.  °[place]  [facility] [place]  [address], [place] (336) 641-7733 Accepts children up to age 21 who are enrolled in Medicaid or [place] Health Choice; pregnant women with a Medicaid card; and children who have applied for Medicaid or [place] Health Choice, but were declined, whose parents can pay a reduced fee at time of service.  °[facility] ° [address], [place] (336) 641-4533 Patients are seen by appointment only. Walk-ins are not accepted. [facility] will see patients 18 years of age and older. °Monday - Tuesday (8am-5pm) °Most Wednesdays (8:30-5pm) °$30 per visit, cash only  °[facility] ° [address], [place] (336) 641-4533 Patients are seen by appointment only. Walk-ins are not accepted. [facility] will see patients 18 years of age and older. °One   Wednesday Evening (Monthly: Volunteer Based).  $30 per visit, cash only  °[facility]  (919) 537-3737 for adults; Children under age 4, call Graduate Pediatric Dentistry at ([phone]) [phone]. Children aged 4-14, please call ([phone]) [phone] to request a pediatric application. ° Dental services are provided in all areas of dental care including fillings, crowns and bridges, complete and partial dentures, implants, gum treatment, root canals, and extractions. Preventive care is also provided. Treatment is provided to both adults and children. °Patients are selected via a lottery and there is often a waiting list. °  °[facility] [address], °[place] ° (336) 763-8833 [email or web address] °  °[facility] [address], [place], [place] (336)723-1848, Ext. 123 Second and Fourth Thursday of each month, opens at 6:30 AM; Clinic ends at 9 AM.  Patients are seen on a first-come first-served basis, and a limited number are seen during each clinic.  ° °[facility] ° [address], [place], [place] (336) 723-7904    Eligibility Requirements °You must have lived in [place], [place], or [place] counties for at least the last three months. °  You cannot be eligible for state or federal sponsored healthcare insurance, including Veterans Administration, Medicaid, or Medicare. °  You generally cannot be eligible for healthcare insurance through your employer.  °  How to apply: °Eligibility screenings are held every Tuesday and Wednesday afternoon from 1:00 pm until 4:00 pm. You do not need an appointment for the interview!  °[facility] [address], [place], [place] 336-631-2330   °[facility]  336-342-8273   °[facility]  336-703-3100   °[facility]  336-570-6415   ° °Behavioral Health Resources in the Community: °Intensive Outpatient Programs °Organization         Address  Phone  Notes  °[facility] [address], [place], [place] 336-878-6098   °[facility] [address], [place], [place] 336-832-9800   °[facility] [address], [place], [place] ° 336-882-2125   °[facility] [address],  °[place], [place] 1-800-853-5163 or 336-641-4981   °Substance Abuse Resources °Organization         Address  Phone  Notes  °[facility]  336-882-2125   °[facility]  336-784-9470   °[facility]  336-285-9073   °[facility]  336-845-3988   °Residential & Outpatient Substance Abuse Program  1-800-659-3381   °Psychological Services °Organization         Address  Phone  Notes  °[facility]  336- 832-9600   °[facility]  336- 378-7881   °[facility] [address], [place] 1-800-853-5163 or 336-641-4981   ° °Mobile Crisis Teams °Organization         Address  Phone  Notes  °[facility], Mobile Crisis Care Unit  1-877-626-1772   °Assertive °[facility] ° [address]  [place], [place] 336-834-9664   °Sharon DeEsch [address] °[place] 336-554-5454   ° °Self-Help/Support Groups °Organization         Address  Phone             Notes  °[facility] [place] [facility] - variety of support groups  336- 373-1402 Call for more information  °Narcotics Anonymous (NA), [facility] [address], °[place]   2 meetings at this location  ° °  Residential Treatment Programs °Organization         Address  Phone  Notes  °[facility] [address],    °[place]  1-866-801-8205   °[facility] ° [address], [place], [place] 704-293-8524   °[facility] [address], [place] 336-845-3988 Admissions: 8am-3pm M-F  °[facility] [address]    °[place], [place] 336-841-1104   °[facility] [address], [place], [place] 336-379-7146   °[facility] [address]  °[place], [place] 336-285-9073   °[facility] - Intensive Outpatient [address], [place], [place] 336-852-3033   °[facility] [address]  °[place], [place] 1-877-615-2722 or 336-784-9470   °[facility] [address] [place], [place] 336-227-7417 Accepts Medicaid  °[facility] [address]  °[place] 1-800-659-3381 Substance Abuse/Addiction Treatment  ° °[place] Behavioral Health Resources °Organization         Address  Phone  Notes  °[facility]  (888) 581-9988   °Julie Brannon, PhD [address] [place], [place]   (336) 349-5553 or (336) 951-0000   °[facility]   [address] °[place], [place] (336) 349-4454   °[facility] [address], [place], [place] (336) 342-8316 Insurance/Medicaid/sponsorship through [facility]  °[facility] [address]                                    [place], [place] (336) 342-8316 Therapy/tele-psych/case    °[facility] [address]  ° [place], [place] (336) 349-2233    °Dr. Arfeen  (336) 349-4544   °[facility] [place]  [employer] [place] [facility] 1) [address], [place] °2) [address], [place] °3)  [address], [place] (336) 349-3220 °(336) 342-7768 ° °(336) 342-8140   °[place] [facility] (336) 342-1394 or (336) 342-3537 (After Hours)    ° ° °Take the prescription as directed.  Apply moist heat or ice to the area(s) of discomfort, for 15 minutes at a time, several times per day for the next few days.  Do not fall asleep on a heating or ice pack.  Call your regular medical doctor tomorrow to schedule a follow up appointment in the next 2 days.  Return to the Emergency Department immediately if worsening. ° °

## 2016-03-15 ENCOUNTER — Institutional Professional Consult (permissible substitution): Payer: Medicare Other | Admitting: Pulmonary Disease

## 2016-07-07 ENCOUNTER — Ambulatory Visit: Payer: Medicare Other | Admitting: Family

## 2017-08-28 IMAGING — DX DG SHOULDER 2+V*L*
3 series · 3 of 3 positions shown · non-contrast
Comparison: 08/04/2011

CLINICAL DATA: Acute left shoulder pain, left chest pain

EXAM:
LEFT SHOULDER - 2+ VIEW

[shoulder grashey]
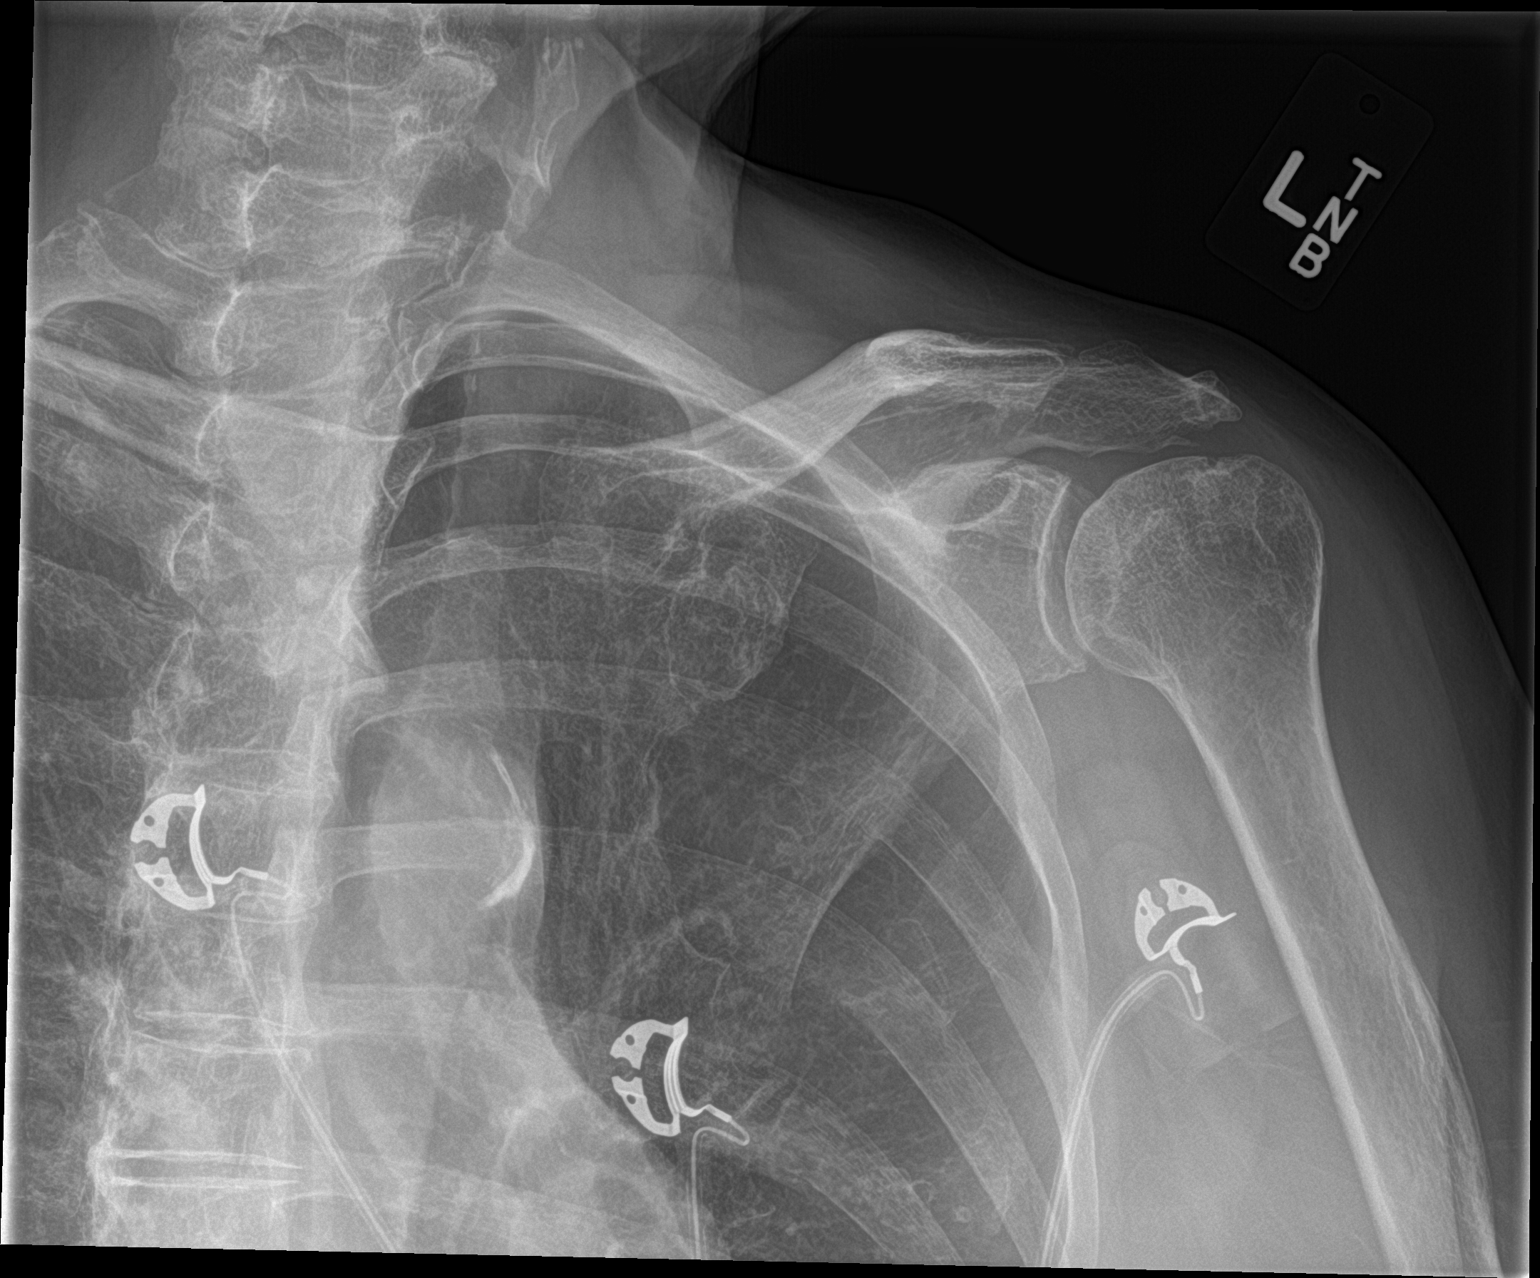

[shoulder y view]
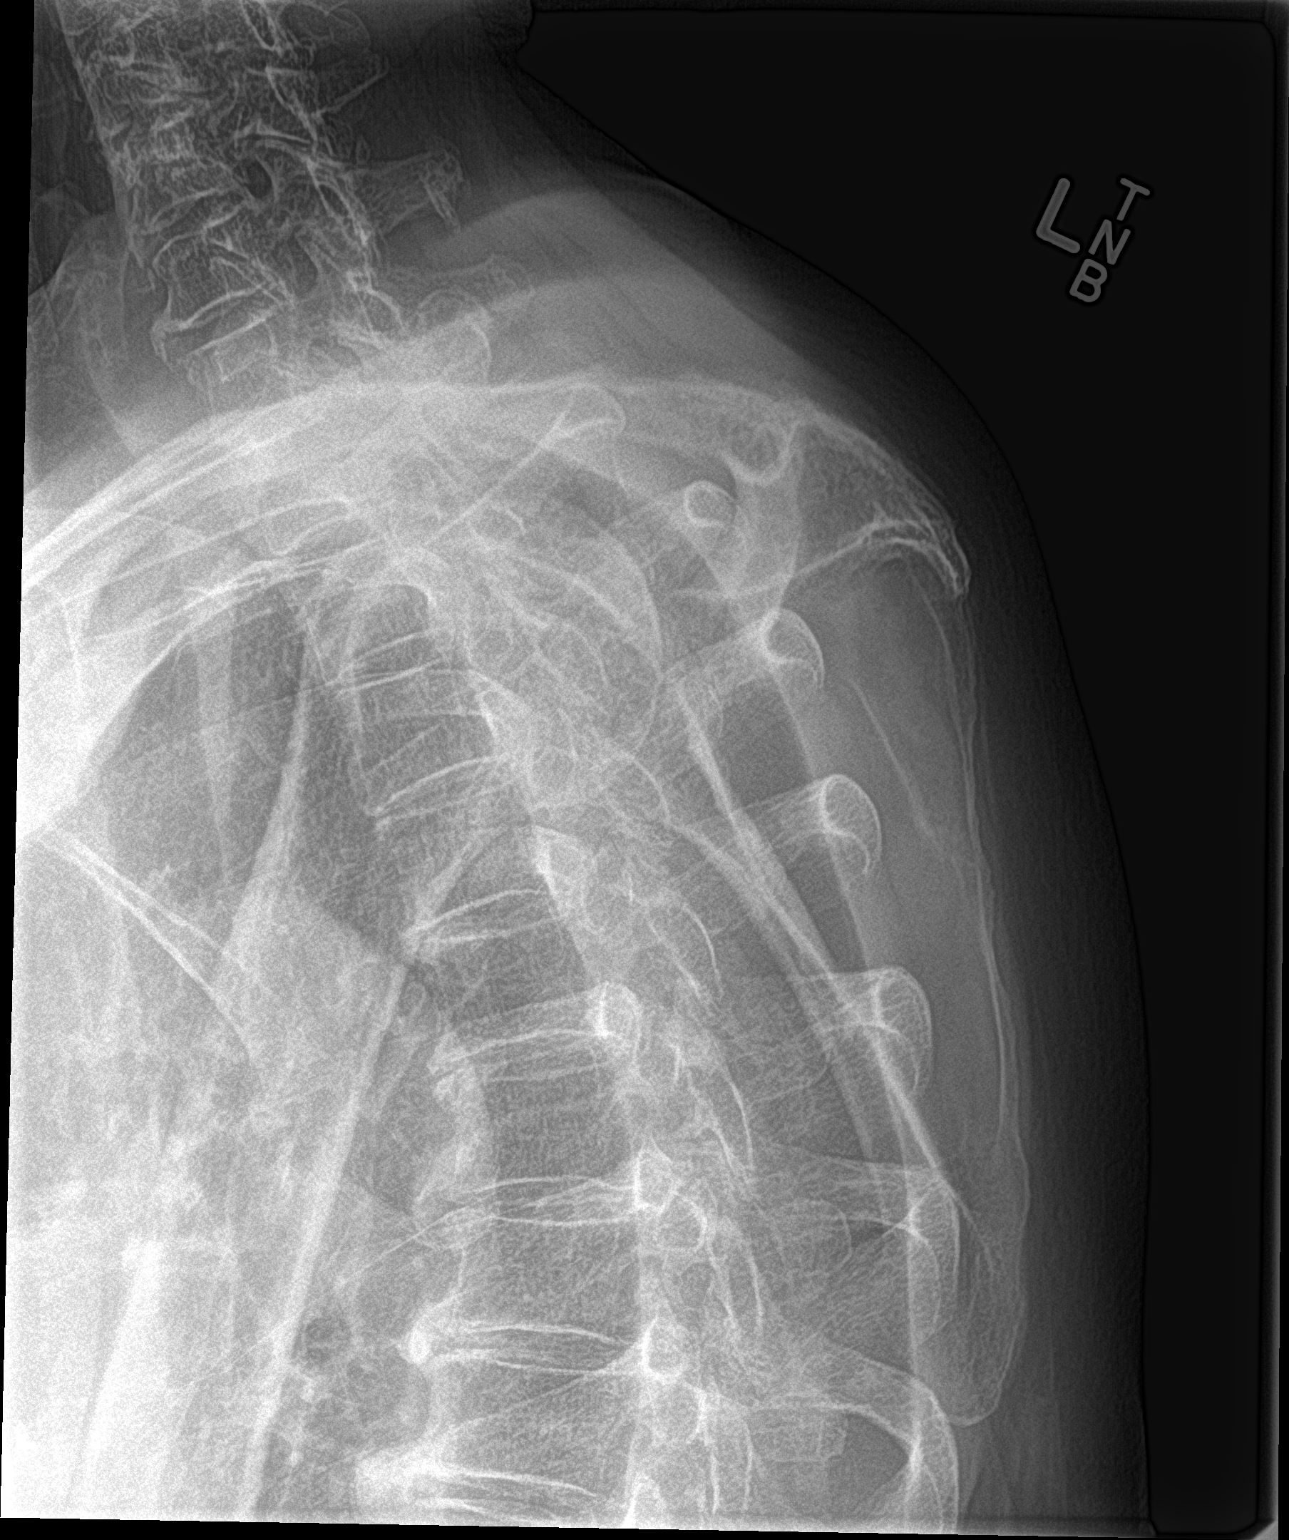

[shoulder axillary]
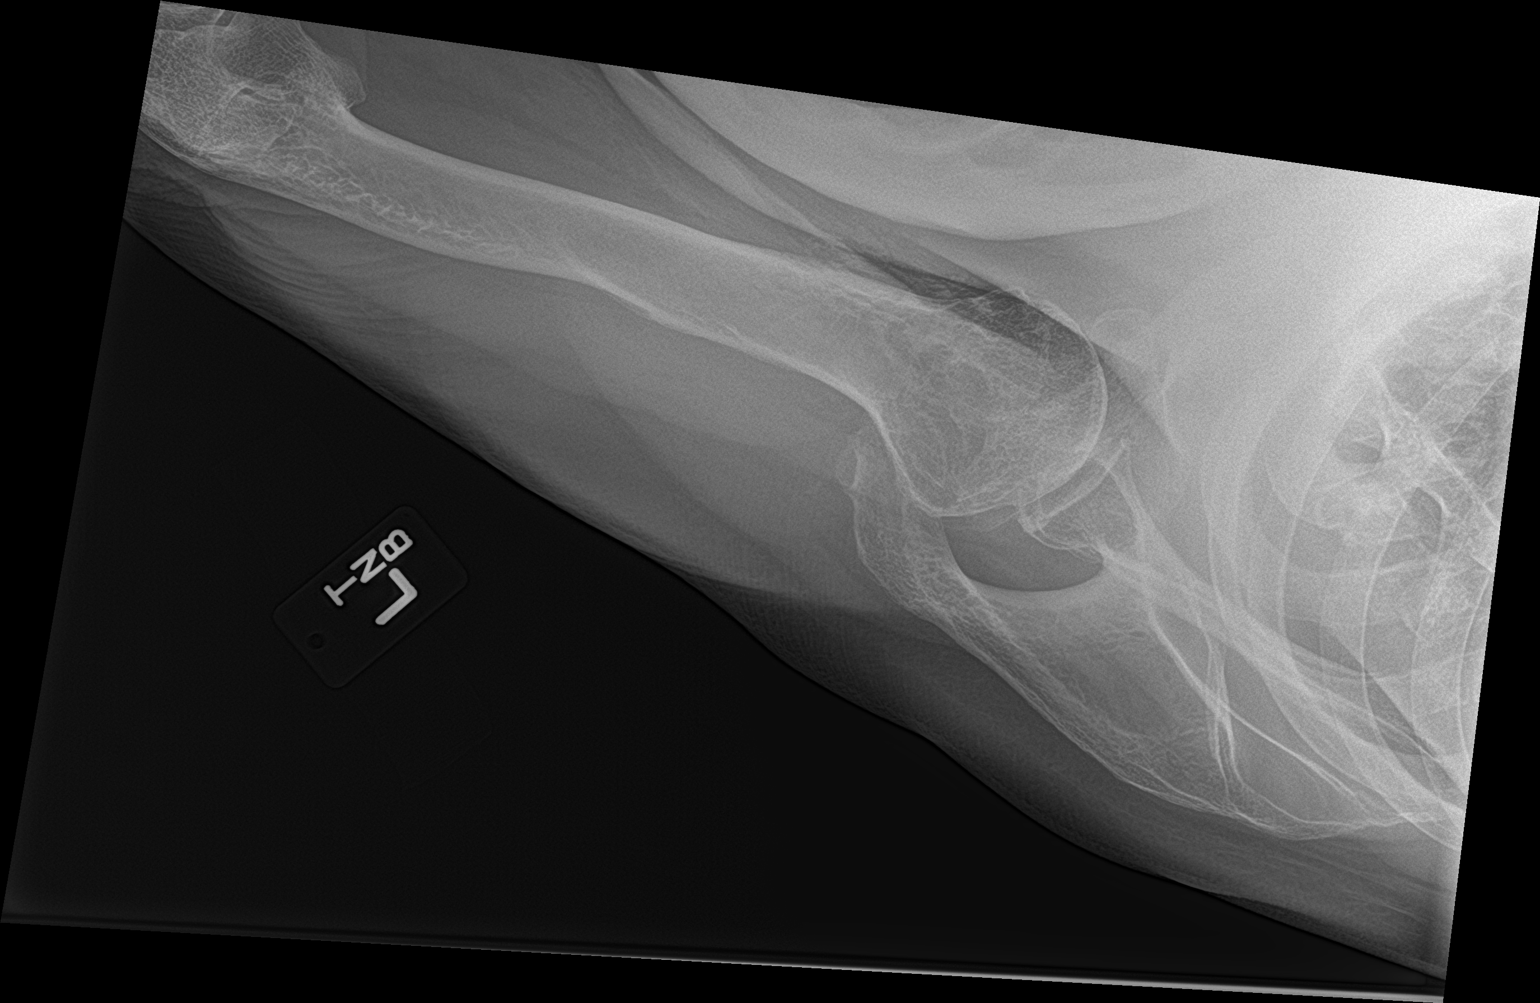

[3 of 3 positions shown; findings below may reference images not displayed]

FINDINGS: Bones are osteopenic. Degenerative changes of the AC joint and
glenohumeral joint. No acute displaced fracture, dislocation or
separation. Left upper lobe remains clear. Bones are osteopenic.
Aortic atherosclerosis noted.
IMPRESSION: Degenerative changes and osteopenia. No acute finding by plain
radiography.

## 2019-06-11 DEATH — deceased
